# Patient Record
Sex: Male | Born: 1960 | Race: Black or African American | Hispanic: No | Marital: Married | Smoking: Former smoker
Health system: Southern US, Community
[De-identification: ages and names within clinical notes are randomized; demographics above are authoritative.]

## PROBLEM LIST (undated history)

## (undated) DIAGNOSIS — I10 Essential (primary) hypertension: Secondary | ICD-10-CM

## (undated) DIAGNOSIS — K219 Gastro-esophageal reflux disease without esophagitis: Secondary | ICD-10-CM

## (undated) DIAGNOSIS — N12 Tubulo-interstitial nephritis, not specified as acute or chronic: Secondary | ICD-10-CM

## (undated) DIAGNOSIS — D649 Anemia, unspecified: Secondary | ICD-10-CM

## (undated) HISTORY — PX: INGUINAL HERNIA REPAIR: SUR1180

## (undated) HISTORY — DX: Essential (primary) hypertension: I10

## (undated) HISTORY — DX: Gastro-esophageal reflux disease without esophagitis: K21.9

## (undated) HISTORY — DX: Anemia, unspecified: D64.9

---

## 2016-03-08 ENCOUNTER — Encounter (HOSPITAL_BASED_OUTPATIENT_CLINIC_OR_DEPARTMENT_OTHER): Payer: Self-pay | Admitting: *Deleted

## 2016-03-08 ENCOUNTER — Emergency Department (HOSPITAL_BASED_OUTPATIENT_CLINIC_OR_DEPARTMENT_OTHER)
Admission: EM | Admit: 2016-03-08 | Discharge: 2016-03-08 | Disposition: A | Discharge status: Home / Self Care | Payer: Worker's Compensation | Attending: Emergency Medicine | Admitting: Emergency Medicine

## 2016-03-08 ENCOUNTER — Emergency Department (HOSPITAL_BASED_OUTPATIENT_CLINIC_OR_DEPARTMENT_OTHER): Payer: Worker's Compensation

## 2016-03-08 DIAGNOSIS — S60122A Contusion of left index finger with damage to nail, initial encounter: Secondary | ICD-10-CM | POA: Diagnosis not present

## 2016-03-08 DIAGNOSIS — S62639A Displaced fracture of distal phalanx of unspecified finger, initial encounter for closed fracture: Secondary | ICD-10-CM

## 2016-03-08 DIAGNOSIS — Y990 Civilian activity done for income or pay: Secondary | ICD-10-CM | POA: Diagnosis not present

## 2016-03-08 DIAGNOSIS — S61311A Laceration without foreign body of left index finger with damage to nail, initial encounter: Secondary | ICD-10-CM | POA: Diagnosis not present

## 2016-03-08 DIAGNOSIS — S6992XA Unspecified injury of left wrist, hand and finger(s), initial encounter: Secondary | ICD-10-CM | POA: Diagnosis present

## 2016-03-08 DIAGNOSIS — Y9389 Activity, other specified: Secondary | ICD-10-CM | POA: Diagnosis not present

## 2016-03-08 DIAGNOSIS — W228XXA Striking against or struck by other objects, initial encounter: Secondary | ICD-10-CM | POA: Diagnosis not present

## 2016-03-08 DIAGNOSIS — S62631A Displaced fracture of distal phalanx of left index finger, initial encounter for closed fracture: Secondary | ICD-10-CM | POA: Insufficient documentation

## 2016-03-08 DIAGNOSIS — Y9289 Other specified places as the place of occurrence of the external cause: Secondary | ICD-10-CM | POA: Diagnosis not present

## 2016-03-08 DIAGNOSIS — Z23 Encounter for immunization: Secondary | ICD-10-CM | POA: Diagnosis not present

## 2016-03-08 MED ORDER — OXYCODONE-ACETAMINOPHEN 5-325 MG PO TABS
1.0000 | ORAL_TABLET | ORAL | Status: DC | PRN
  Administered 2016-03-08: 1 via ORAL
  Filled 2016-03-08: qty 1

## 2016-03-08 MED ORDER — TETANUS-DIPHTH-ACELL PERTUSSIS 5-2.5-18.5 LF-MCG/0.5 IM SUSP
0.5000 mL | Freq: Once | INTRAMUSCULAR | Status: AC
  Administered 2016-03-08: 0.5 mL via INTRAMUSCULAR
  Filled 2016-03-08: qty 0.5

## 2016-03-08 NOTE — ED Notes (Signed)
MD at bedside. 

## 2016-03-08 NOTE — ED Notes (Signed)
Swelling to tip of left index finger with laceration around cuticle, bleeding controlled

## 2016-03-08 NOTE — ED Notes (Signed)
Pt soaking finger in saline

## 2016-03-08 NOTE — Discharge Instructions (Signed)
°Cast or Splint Care  ° ° °Casts and splints support injured limbs and keep bones from moving while they heal. It is important to care for your cast or splint at home.  °HOME CARE INSTRUCTIONS  °Keep the cast or splint uncovered during the drying period. It can take 24 to 48 hours to dry if it is made of plaster. A fiberglass cast will dry in less than 1 hour.  °Do not rest the cast on anything harder than a pillow for the first 24 hours.  °Do not put weight on your injured limb or apply pressure to the cast until your health care provider gives you permission.  °Keep the cast or splint dry. Wet casts or splints can lose their shape and may not support the limb as well. A wet cast that has lost its shape can also create harmful pressure on your skin when it dries. Also, wet skin can become infected.  °Cover the cast or splint with a plastic bag when bathing or when out in the rain or snow. If the cast is on the trunk of the body, take sponge baths until the cast is removed.  °If your cast does become wet, dry it with a towel or a blow dryer on the cool setting only. °Keep your cast or splint clean. Soiled casts may be wiped with a moistened cloth.  °Do not place any hard or soft foreign objects under your cast or splint, such as cotton, toilet paper, lotion, or powder.  °Do not try to scratch the skin under the cast with any object. The object could get stuck inside the cast. Also, scratching could lead to an infection. If itching is a problem, use a blow dryer on a cool setting to relieve discomfort.  °Do not trim or cut your cast or remove padding from inside of it.  °Exercise all joints next to the injury that are not immobilized by the cast or splint. For example, if you have a long leg cast, exercise the hip joint and toes. If you have an arm cast or splint, exercise the shoulder, elbow, thumb, and fingers.  °Elevate your injured arm or leg on 1 or 2 pillows for the first 1 to 3 days to decrease swelling and  pain. It is best if you can comfortably elevate your cast so it is higher than your heart. °SEEK MEDICAL CARE IF:  °Your cast or splint cracks.  °Your cast or splint is too tight or too loose.  °You have unbearable itching inside the cast.  °Your cast becomes wet or develops a soft spot or area.  °You have a bad smell coming from inside your cast.  °You get an object stuck under your cast.  °Your skin around the cast becomes red or raw.  °You have new pain or worsening pain after the cast has been applied. °SEEK IMMEDIATE MEDICAL CARE IF:  °You have fluid leaking through the cast.  °You are unable to move your fingers or toes.  °You have discolored (blue or white), cool, painful, or very swollen fingers or toes beyond the cast.  °You have tingling or numbness around the injured area.  °You have severe pain or pressure under the cast.  °You have any difficulty with your breathing or have shortness of breath.  °You have chest pain. °This information is not intended to replace advice given to you by your health care provider. Make sure you discuss any questions you have with your health care provider.  °  Document Released: 11/10/2000 Document Revised: 09/03/2013 Document Reviewed: 05/22/2013  °Elsevier Interactive Patient Education ©2016 Elsevier [employer]  ° °

## 2016-03-08 NOTE — ED Notes (Signed)
Pt c/o injury to left index finger x 3 hrs ago

## 2016-03-08 NOTE — ED Notes (Signed)
Steri Strips applied by provider

## 2016-03-08 NOTE — ED Notes (Signed)
Pt verbalizes understanding of d/c instructions and denies any further needs at this time. 

## 2016-03-08 NOTE — ED Provider Notes (Signed)
CSN: [id number]     Arrival date & time 03/08/16  1956 History  By signing my name below, I, Dora Sims, attest that this documentation has been prepared under the direction and in the presence of physician practitioner, Deno Etienne, DO. Electronically Signed: Dora Sims, Scribe. 03/08/2016. 8:25 PM.    Chief Complaint  Patient presents with  . Finger Injury    The history is provided by the patient. No language interpreter was used.     HPI Comments: Nicholas Miller is a 55 y.o. male with no pertinent PMHx who presents to the Emergency Department complaining of left index finger laceration and pain s/p slamming his left index finger in a truck door approximately 3 hours ago. Pt was at work and crushed his left index finger in a large truck door. Pt is not UTD for tetanus. His bleeding is controlled at this time. He denies other pains or any other symptoms.  History reviewed. No pertinent past medical history. History reviewed. No pertinent past surgical history. History reviewed. No pertinent family history. Social History  Substance Use Topics  . Smoking status: Never Smoker   . Smokeless tobacco: None  . Alcohol Use: No    Review of Systems  Constitutional: Negative for fever and chills.  HENT: Negative for congestion and facial swelling.   Eyes: Negative for discharge and visual disturbance.  Respiratory: Negative for shortness of breath.   Cardiovascular: Negative for chest pain and palpitations.  Gastrointestinal: Negative for vomiting, abdominal pain and diarrhea.  Musculoskeletal: Positive for arthralgias (left index finger). Negative for myalgias.  Skin: Positive for wound (left index finger). Negative for color change and rash.  Neurological: Negative for tremors, syncope and headaches.  Psychiatric/Behavioral: Negative for confusion and dysphoric mood.    Allergies  Review of patient's allergies indicates no known allergies.  Home Medications   Prior to  Admission medications   Not on File   BP 150/85 mmHg  Pulse 93  Temp(Src) 98.3 F (36.8 C)  Resp 16  Ht 5\' 8"  (1.727 m)  Wt 239 lb (108.41 kg)  BMI 36.35 kg/m2  SpO2 98% Physical Exam  Constitutional: He is oriented to person, place, and time. He appears well-developed and well-nourished.  HENT:  Head: Normocephalic and atraumatic.  Eyes: EOM are normal. Pupils are equal, round, and reactive to light.  Neck: Normal range of motion. Neck supple. No JVD present.  Cardiovascular: Normal rate and regular rhythm.  Exam reveals no gallop and no friction rub.   No murmur heard. Pulmonary/Chest: No respiratory distress. He has no wheezes.  Abdominal: He exhibits no distension. There is no rebound and no guarding.  Musculoskeletal: Normal range of motion.  Laceration through the dermis on the ulnar side of the 2nd digit of the left hand, not extended into nailbed Very small ungal hematoma less than 50% of the nail Cap refill intact, less than 2 seconds No difficulty with ROM  Neurological: He is alert and oriented to person, place, and time.  Skin: No rash noted. No pallor.  Psychiatric: He has a normal mood and affect. His behavior is normal.  Nursing note and vitals reviewed.   ED Course  .Marland KitchenLaceration Repair Date/Time: 03/08/2016 9:08 PM Performed by: Tyrone Nine Bryona Foxworthy Authorized by: Deno Etienne Consent: Verbal consent obtained. Risks and benefits: risks, benefits and alternatives were discussed Consent given by: patient Required items: required blood products, implants, devices, and special equipment available Patient identity confirmed: verbally with patient Time out: Immediately prior to procedure a "  time out" was called to verify the correct patient, procedure, equipment, support staff and site/side marked as required. Body area: upper extremity Location details: left index finger Laceration length: 2 cm Foreign bodies: no foreign bodies Tendon involvement: none Nerve  involvement: none Vascular damage: no Preparation: Patient was prepped and draped in the usual sterile fashion. Approximation: loose Approximation difficulty: simple Dressing: 4x4 sterile gauze and splint Patient tolerance: Patient tolerated the procedure well with no immediate complications   (including critical care time)  DIAGNOSTIC STUDIES: Oxygen Saturation is 98% on RA, normal by my interpretation.    COORDINATION OF CARE: 8:39 PM Discussed treatment plan with pt at bedside and pt agreed to plan.  Labs Review Labs Reviewed - No data to display  Imaging Review Dg Finger Index Left  03/08/2016  CLINICAL DATA:  Left index finger injury today. EXAM: LEFT INDEX FINGER 2+V COMPARISON:  None. FINDINGS: Small bone fragment noted off the distal tip of the left index finger distal phalanx. No subluxation or dislocation. IMPRESSION: Small bone fragment off the tip of the distal phalanx of the left index finger. Electronically Signed   By: Rolm Baptise M.D.   On: 03/08/2016 20:43   I have personally reviewed and evaluated these images and lab results as part of my medical decision-making.   EKG Interpretation None      MDM   Final diagnoses:  Distal phalanx or phalanges, closed fracture, initial encounter    55 yo M With a chief complaint of a left pointer finger injury. Patient checked this in a car door. This happened about 3 hours ago. X-ray with a possible small fracture. We'll place in a splint. PCP follow-up. Patient had a wound without nailbed fracture. Steri-Strips   I personally performed the services described in this documentation, which was scribed in my presence. The recorded information has been reviewed and is accurate.    9:10 PM:  I have discussed the diagnosis/risks/treatment options with the patient and family and believe the pt to be eligible for discharge home to follow-up with PCP. We also discussed returning to the ED immediately if new or worsening sx  occur. We discussed the sx which are most concerning (e.g., erythema, fever,) that necessitate immediate return. Medications administered to the patient during their visit and any new prescriptions provided to the patient are listed below.  Medications given during this visit Medications  oxyCODONE-acetaminophen (PERCOCET/ROXICET) 5-325 MG per tablet 1 tablet (1 tablet Oral Given 03/08/16 2010)  Tdap (BOOSTRIX) injection 0.5 mL (0.5 mLs Intramuscular Given 03/08/16 2010)    There are no discharge medications for this patient.   The patient appears reasonably screen and/or stabilized for discharge and I doubt any other medical condition or other [facility] requiring further screening, evaluation, or treatment in the ED at this time prior to discharge.      Deno Etienne, DO 03/08/16 2111

## 2016-03-15 ENCOUNTER — Encounter (HOSPITAL_BASED_OUTPATIENT_CLINIC_OR_DEPARTMENT_OTHER): Payer: Self-pay | Admitting: *Deleted

## 2016-03-15 ENCOUNTER — Emergency Department (HOSPITAL_BASED_OUTPATIENT_CLINIC_OR_DEPARTMENT_OTHER)
Admission: EM | Admit: 2016-03-15 | Discharge: 2016-03-15 | Disposition: A | Discharge status: Home / Self Care | Payer: Worker's Compensation | Attending: Emergency Medicine | Admitting: Emergency Medicine

## 2016-03-15 DIAGNOSIS — Z8781 Personal history of (healed) traumatic fracture: Secondary | ICD-10-CM | POA: Diagnosis not present

## 2016-03-15 DIAGNOSIS — Z09 Encounter for follow-up examination after completed treatment for conditions other than malignant neoplasm: Secondary | ICD-10-CM | POA: Insufficient documentation

## 2016-03-15 DIAGNOSIS — M79645 Pain in left finger(s): Secondary | ICD-10-CM | POA: Insufficient documentation

## 2016-03-15 NOTE — ED Notes (Signed)
Pt states the metal finger splint we placed on his left pointer finger last Wednesday was cutting into his skin when he moved his hand. Pt bought another splint at [facility] that does not cut into his hand, requests md check this splint to make sure it is ok for him to use. Denies any further c/o or needs.

## 2016-03-15 NOTE — ED Provider Notes (Signed)
CSN: [id number]     Arrival date & time 03/15/16  1042 History   First MD Initiated Contact with Patient 03/15/16 1059     Chief Complaint  Patient presents with  . Follow-up     (Consider location/radiation/quality/duration/timing/severity/associated sxs/prior Treatment) HPI   Patient is a 55 year old male who presents to the ED for follow-up. Patient reports he was seen in the ED on 4/12 and was diagnosed with left index finger fracture. He states he was placed in a metal finger splint and notes that the end of splint was cutting into his skin with movement of his hand. He states he bought another splint at [facility] which does not cut into his hand and wanted to make sure that the splint was okay for him to use for his fractured finger. Patient denies any other pain or complaints at this time. He states he has been cleaning his wound daily without any, occasions. Denies fever, chills, redness, swelling, warmth, drainage, numbness, tingling, weakness.  History reviewed. No pertinent past medical history. History reviewed. No pertinent past surgical history. History reviewed. No pertinent family history. Social History  Substance Use Topics  . Smoking status: Never Smoker   . Smokeless tobacco: None  . Alcohol Use: No    Review of Systems  Constitutional: Negative for fever.  Musculoskeletal: Positive for arthralgias (left index finger). Negative for joint swelling.  Skin: Negative for color change.  Neurological: Negative for weakness and numbness.      Allergies  Review of patient's allergies indicates no known allergies.  Home Medications   Prior to Admission medications   Not on File   BP 135/87 mmHg  Pulse 78  Temp(Src) 98.8 F (37.1 C) (Oral)  Resp 18  SpO2 98% Physical Exam  Constitutional: He is oriented to person, place, and time. He appears well-developed and well-nourished. No distress.  HENT:  Head: Normocephalic and atraumatic.  Eyes: Conjunctivae and  EOM are normal. Right eye exhibits no discharge. Left eye exhibits no discharge. No scleral icterus.  Neck: Normal range of motion. Neck supple.  Cardiovascular: Normal rate.   Pulmonary/Chest: Effort normal. No respiratory distress.  Musculoskeletal: Normal range of motion. He exhibits no edema.  Healing laceration at dorsal aspect of left index finger proximal to nail bed with 2 steri strips in place. No surrounding swelling, warmth, erythema or drainage. FROM of left 2nd digit, mild TTP over left 2nd distal phalanx. 2+ radial pulse. Sensation intact. Cap refill <2. Small subungual hematoma, less than 25% of nail.  Neurological: He is alert and oriented to person, place, and time. He has normal strength. No sensory deficit.  Skin: Skin is warm and dry.  Nursing note and vitals reviewed.   ED Course  Procedures (including critical care time) Labs Review Labs Reviewed - No data to display  Imaging Review No results found. I have personally reviewed and evaluated these images and lab results as part of my medical decision-making.   EKG Interpretation None      MDM   Final diagnoses:  Follow up    Pt presents for follow-up regarding splint for his finger fracture. Chart review shows patient was seen in the ED on 4/12, diagnosed with left second distal phalanx fracture, tetanus updated in the ED, patient discharged home with finger splint and symptomatically treatment. VSS. Exam reveals healing laceration to left index finger with 2 Steri-Strips in place, no evidence of infection. Left hand neurovascularly intact. Healing left second subungual hematoma which appears to have  improved since initial evaluation. I evaluated the patient's new splint which is a U-shaped splint that extends past the tip of the distal phalanx down to the proximal phalanx. Advised patient to continue with his wound care and continue wearing his splint daily. Advised patient to follow up with his PCP in the next  1-2 weeks.    Chesley Noon [place], [place] 03/15/16 [place], MD 03/16/16 [phone]

## 2016-03-15 NOTE — Discharge Instructions (Signed)
I recommend continuing to take ibuprofen as prescribed over-the-counter as needed for pain relief. He may also continue to rest, elevate and ice your finger for 15-20 minutes 3-4 daily. Continue to keep wound clean using antibacterial soap and water and pat dry. Continue to use ear finger splint for the next week. Follow-up with your primary care provider in one week if you continue to have pain. Please return to the Emergency Department if symptoms worsen or new onset of fever, redness, swelling, warmth, drainage, numbness, tingling, weakness.

## 2016-03-22 ENCOUNTER — Emergency Department (HOSPITAL_BASED_OUTPATIENT_CLINIC_OR_DEPARTMENT_OTHER)
Admission: EM | Admit: 2016-03-22 | Discharge: 2016-03-22 | Disposition: A | Discharge status: Home / Self Care | Payer: Worker's Compensation | Attending: Emergency Medicine | Admitting: Emergency Medicine

## 2016-03-22 ENCOUNTER — Encounter (HOSPITAL_BASED_OUTPATIENT_CLINIC_OR_DEPARTMENT_OTHER): Payer: Self-pay | Admitting: Emergency Medicine

## 2016-03-22 DIAGNOSIS — Z4801 Encounter for change or removal of surgical wound dressing: Secondary | ICD-10-CM | POA: Diagnosis not present

## 2016-03-22 DIAGNOSIS — S62609D Fracture of unspecified phalanx of unspecified finger, subsequent encounter for fracture with routine healing: Secondary | ICD-10-CM

## 2016-03-22 NOTE — ED Provider Notes (Signed)
CSN: [id number]     Arrival date & time 03/22/16  0914 History   First MD Initiated Contact with Patient 03/22/16 [id number]     Chief Complaint  Patient presents with  . Wound Check     (Consider location/radiation/quality/duration/timing/severity/associated sxs/prior Treatment) HPI Comments: 55 year old male presents to the emergency department for follow-up. The patient was seen on the 12th of this month-2 weeks ago for finger injury and was noted to have a small fracture. He was placed in a splint and then returned because the splint that we had applied was hurting him and he had started using a different splint from [facility]. He returns today because his work has been keeping him on light duty and they want to know when he can go back to full duty. He reports that the finger has been healing well and is feeling better but he still has pain with heavy lifting. He has not followed up outpatient for this issue and does not have a primary care doctor.   History reviewed. No pertinent past medical history. History reviewed. No pertinent past surgical history. History reviewed. No pertinent family history. Social History  Substance Use Topics  . Smoking status: Never Smoker   . Smokeless tobacco: None  . Alcohol Use: No    Review of Systems  Constitutional: Negative for fever and chills.  Cardiovascular: Negative for chest pain.  Gastrointestinal: Negative for abdominal pain.  Genitourinary: Negative for flank pain.  Musculoskeletal: Positive for arthralgias (left index finger).  Skin: Positive for wound.  Neurological: Negative for headaches.  Hematological: Does not bruise/bleed easily.      Allergies  Review of patient's allergies indicates no known allergies.  Home Medications   Prior to Admission medications   Not on File   BP 136/93 mmHg  Pulse 84  Temp(Src) 98.7 F (37.1 C)  Resp 18  Ht 5\' 8"  (1.727 m)  Wt 240 lb (108.863 kg)  BMI 36.50 kg/m2  SpO2 98% Physical  Exam  Constitutional: He is oriented to person, place, and time. He appears well-developed and well-nourished. No distress.  HENT:  Head: Normocephalic and atraumatic.  Right Ear: External ear normal.  Left Ear: External ear normal.  Eyes: EOM are normal. Pupils are equal, round, and reactive to light.  Neck: Normal range of motion. Neck supple.  Cardiovascular: Intact distal pulses.   Pulmonary/Chest: Effort normal. No respiratory distress.  Abdominal: He exhibits no distension.  Musculoskeletal: He exhibits no edema.  Well-healed laceration the dorsum of the left index finger proximal to the nailbed. No Steri-Strips noted. Mild swelling around the nailbed. No warmth or erythema or drainage. Patient has full range of motion of the left index finger. Tenderness to palpation over the nailbed and distal phalanx. He has good capillary refill in his left index finger and normal sensation. Small subungual hematoma that is less than 10% of the nail.  Neurological: He is alert and oriented to person, place, and time.  Skin: Skin is warm and dry. No rash noted. He is not diaphoretic.  Vitals reviewed.   ED Course  Procedures (including critical care time) Labs Review Labs Reviewed - No data to display  Imaging Review No results found. I have personally reviewed and evaluated these images and lab results as part of my medical decision-making.   EKG Interpretation None      MDM  Patient was seen and evaluated in stable condition. Appears to have a well-healing finger laceration and fracture. Patient was educated on need  to find a provider who can perform routine follow-up examinations and determine when he can return to normal duty at work. At this time will provide note for continued light duty. Instructed the patient to follow up outpatient which he expressed understanding of.  I told him it was in his best interest to have the same provider evaluating it each time. Patient was discharged in  stable condition with instruction to continue to use the splint. Final diagnoses:  None    1. Recheck, left index finger last laceration and fracture    Harvel Quale, MD 03/22/16 1005

## 2016-03-22 NOTE — Discharge Instructions (Signed)
You were seen and evaluated again today for your finger fracture. This appears to be healing well. Continued to stay on light duty at this time at your place of work. He need to follow up outpatient with the primary care doctor who can continue to recheck this and he'll will be able to compare how the finger is healing each visit in order to instruct you better on when to return to full work. Continue to ice and elevate the finger. Continue to keep it in the finger splint. It appears to be healing well but will take time.  Finger Fracture Fractures of fingers are breaks in the bones of the fingers. There are many types of fractures. There are different ways of treating these fractures. Your health care provider will discuss the best way to treat your fracture. CAUSES Traumatic injury is the main cause of broken fingers. These include:  Injuries while playing sports.  Workplace injuries.  Falls. RISK FACTORS Activities that can increase your risk of finger fractures include:  Sports.  Workplace activities that involve machinery.  A condition called osteoporosis, which can make your bones less dense and cause them to fracture more easily. SIGNS AND SYMPTOMS The main symptoms of a broken finger are pain and swelling within 15 minutes after the injury. Other symptoms include:  Bruising of your finger.  Stiffness of your finger.  Numbness of your finger.  Exposed bones (compound fracture) if the fracture is severe. DIAGNOSIS  The best way to diagnose a broken bone is with X-ray imaging. Additionally, your health care provider will use this X-ray image to evaluate the position of the broken finger bones.  TREATMENT  Finger fractures can be treated with:   Nonreduction--This means the bones are in place. The finger is splinted without changing the positions of the bone pieces. The splint is usually left on for about a week to 10 days. This will depend on your fracture and what your health  care provider thinks.  Closed reduction--The bones are put back into position without using surgery. The finger is then splinted.  Open reduction and internal fixation--The fracture site is opened. Then the bone pieces are fixed into place with pins or some type of hardware. This is seldom required. It depends on the severity of the fracture. HOME CARE INSTRUCTIONS   Follow your health care provider's instructions regarding activities, exercises, and physical therapy.  Only take over-the-counter or prescription medicines for pain, discomfort, or fever as directed by your health care provider. SEEK MEDICAL CARE IF: You have pain or swelling that limits the motion or use of your fingers. SEEK IMMEDIATE MEDICAL CARE IF:  Your finger becomes numb. MAKE SURE YOU:   Understand these instructions.  Will watch your condition.  Will get help right away if you are not doing well or get worse.   This information is not intended to replace advice given to you by your health care provider. Make sure you discuss any questions you have with your health care provider.   Document Released: 02/25/2001 Document Revised: 09/03/2013 Document Reviewed: 06/25/2013 Elsevier Interactive Patient Education [employer].

## 2017-01-18 ENCOUNTER — Encounter (HOSPITAL_BASED_OUTPATIENT_CLINIC_OR_DEPARTMENT_OTHER): Payer: Self-pay | Admitting: Emergency Medicine

## 2017-01-18 ENCOUNTER — Emergency Department (HOSPITAL_BASED_OUTPATIENT_CLINIC_OR_DEPARTMENT_OTHER): Payer: Self-pay

## 2017-01-18 ENCOUNTER — Emergency Department (HOSPITAL_BASED_OUTPATIENT_CLINIC_OR_DEPARTMENT_OTHER)
Admission: EM | Admit: 2017-01-18 | Discharge: 2017-01-18 | Disposition: A | Discharge status: Home / Self Care | Payer: Self-pay | Attending: Emergency Medicine | Admitting: Emergency Medicine

## 2017-01-18 DIAGNOSIS — J069 Acute upper respiratory infection, unspecified: Secondary | ICD-10-CM | POA: Insufficient documentation

## 2017-01-18 MED ORDER — FLUTICASONE PROPIONATE 50 MCG/ACT NA SUSP: 2.0000 | Freq: Every day | NASAL | 0 refills | Status: DC

## 2017-01-18 MED ORDER — BENZONATATE 100 MG PO CAPS: 100.0000 mg | ORAL_CAPSULE | Freq: Three times a day (TID) | ORAL | 0 refills | Status: DC

## 2017-01-18 MED ORDER — DM-GUAIFENESIN ER 30-600 MG PO TB12: 1.0000 | ORAL_TABLET | Freq: Two times a day (BID) | ORAL | 0 refills | Status: DC | PRN

## 2017-01-18 MED FILL — MUCINEX DM ER 600-30 MG TAB: 30-600 | 10 days supply | Qty: 20 | Fill #0

## 2017-01-18 MED FILL — FLUTICASONE PROP 50 MCG SPR: 50 | 30 days supply | Qty: 16 | Fill #0

## 2017-01-18 MED FILL — BENZONATATE 100 MG CAP: 100 | 7 days supply | Qty: 21 | Fill #0

## 2017-01-18 NOTE — ED Notes (Signed)
Cough, yellow mucus with fever and aching since yesterday.

## 2017-01-18 NOTE — Discharge Instructions (Signed)
1. Medications: flonase, mucinex, tessalon, usual home medications 2. Treatment: rest, drink plenty of fluids, take tylenol or ibuprofen for fever control 3. Follow Up: Please followup with your primary doctor in 3 days for discussion of your diagnoses and further evaluation after today's visit; if you do not have a primary care doctor use the resource guide provided to find one; Return to the ER for high fevers, difficulty breathing or other concerning symptoms   Get help right away if: You have severe or persistent: Headache. Ear pain. Sinus pain. Chest pain. You have chronic lung disease and any of the following: Wheezing. Prolonged cough. Coughing up blood. A change in your usual mucus. You have a stiff neck. You have changes in your: Vision. Hearing. Thinking. Mood.

## 2017-01-18 NOTE — ED Triage Notes (Signed)
Patient states that he has had a fever since last night  - the patient reports that he also has a cough

## 2017-01-18 NOTE — ED Provider Notes (Signed)
[facility] DEPT [facility] Provider Note   CSN: [id number] Arrival date & time: 01/18/17  1515  By signing my name below, I, Dora Sims, attest that this documentation has been prepared under the direction and in the presence of Fabianna Keats, [place]. Electronically Signed: Dora Sims, Scribe. 01/18/2017. 4:10 PM.  History   Chief Complaint Chief Complaint  Patient presents with  . Fever    The history is provided by the patient. No language interpreter was used.     HPI Comments: Nicholas Miller is a 56 y.o. male who presents to the Emergency Department complaining of persistent subjective fever and chills beginning last night. He reports associated nasal congestion, occasional episodes of diaphoresis, and a productive cough with yellow/green sputum. Patient notes his symptoms have improved somewhat since last night. He has tried Nyquil and Tylenol with mild improvement of his symptoms. No h/o COPD or asthma. He is a non-smoker. Pt denies nausea, vomiting, diarrhea, abdominal pain, body aches, or any other associated symptoms.  History reviewed. No pertinent past medical history.  There are no active problems to display for this patient.   History reviewed. No pertinent surgical history.     Home Medications    Prior to Admission medications   Medication Sig Start Date End Date Taking? Authorizing Provider  benzonatate (TESSALON) 100 MG capsule Take 1 capsule (100 mg total) by mouth every 8 (eight) hours. 01/18/17   Ciarra Braddy Manuel [place], [place]  dextromethorphan-guaiFENesin (MUCINEX DM) 30-600 MG 12hr tablet Take 1 tablet by mouth 2 (two) times daily as needed for cough. 01/18/17   Galdino Hinchman Manuel [place], [place]  fluticasone (FLONASE) 50 MCG/ACT nasal spray Place 2 sprays into both nostrils daily. 01/18/17   [place], [place]    Family History History reviewed. No pertinent family history.  Social History Social History  Substance Use Topics  . Smoking status:  Never Smoker  . Smokeless tobacco: Never Used  . Alcohol use No     Allergies   Patient has no known allergies.   Review of Systems Review of Systems  Constitutional: Positive for chills, diaphoresis and fever.  HENT: Positive for congestion.   Respiratory: Positive for cough.   Gastrointestinal: Negative for abdominal pain, diarrhea, nausea and vomiting.  Musculoskeletal: Negative for myalgias.     Physical Exam Updated Vital Signs BP 133/86 (BP Location: Right Arm)   Pulse 105   Temp 100 F (37.8 C) (Oral)   Resp 18   Ht 5\' 8"  (1.727 m)   Wt 106.1 kg   SpO2 100%   BMI 35.58 kg/m   Physical Exam  Constitutional: He is oriented to person, place, and time. He appears well-developed and well-nourished.  Well appearing  HENT:  Head: Normocephalic and atraumatic.  Right Ear: External ear normal.  Left Ear: External ear normal.  Nose: Nose normal.  Mouth/Throat: Oropharynx is clear and moist. No oropharyngeal exudate.  Oropharynx without evidence of redness or exudates. Tonsils without evidence of redness, swelling, or exudates. TM's appear normal with no evidence of bulging. EAC appear non erythematous and not swollen  Eyes: EOM are normal. Pupils are equal, round, and reactive to light.  Neck: Normal range of motion.  Normal ROM. No nuchal rigidity.   Cardiovascular: Normal rate and normal heart sounds.   Pulmonary/Chest: Effort normal and breath sounds normal. No respiratory distress. He has no wheezes. He has no rales.  Lungs CTA. No wheezing. No rales. No stridor. Normal work of breathing  Abdominal: Soft. There is no  tenderness. There is no rebound and no guarding.  Soft and nontender. No rebound. No guarding. Negative murphy's sign. No focal tenderness at McBurney's point. No CVA tenderness. No evidence of hernia  Neurological: He is alert and oriented to person, place, and time.  Skin: Skin is warm.  Psychiatric: He has a normal mood and affect. His behavior is  normal.  Nursing note and vitals reviewed.    ED Treatments / Results  Labs (all labs ordered are listed, but only abnormal results are displayed) Labs Reviewed - No data to display  EKG  EKG Interpretation None       Radiology Dg Chest 2 View  Result Date: 01/18/2017 CLINICAL DATA:  Cough, fever, body aches for 2 day EXAM: CHEST  2 VIEW COMPARISON:  None. FINDINGS: Normal heart size. Lungs clear. No pneumothorax. No pleural effusion. IMPRESSION: No active cardiopulmonary disease. Electronically Signed   By: Marybelle Killings M.D.   On: 01/18/2017 16:04    Procedures Procedures (including critical care time)  DIAGNOSTIC STUDIES: Oxygen Saturation is 100% on RA, normal by my interpretation.    COORDINATION OF CARE: 4:17 PM Discussed treatment plan with pt at bedside and pt agreed to plan.  Medications Ordered in ED Medications - No data to display   Initial Impression / Assessment and Plan / ED Course  I have reviewed the triage vital signs and the nursing notes.  Pertinent labs & imaging results that were available during my care of the patient were reviewed by me and considered in my medical decision making (see chart for details).    Patients symptoms are consistent with URI, likely viral etiology. On exam, pt in NAD. No hypoxia. Low grade fever. Lungs clear, Heart sounds clear. Normal work of breathing. TMs clear. Throat benign. Abdomen nontender/soft. Pt CXR negative for acute infiltrate. Discussed that antibiotics are not indicated for viral infections. Pt will be discharged with symptomatic treatment.  Verbalizes understanding and is agreeable with plan. Pt is hemodynamically stable & in NAD prior to dc. Patient given instructions to follow up with primary care provider in 3-5 days during today's visit. Resistance to immediately return to the emergency department discussed.   Final Clinical Impressions(s) / ED Diagnoses   Final diagnoses:  Upper respiratory tract  infection, unspecified type    New Prescriptions New Prescriptions   BENZONATATE (TESSALON) 100 MG CAPSULE    Take 1 capsule (100 mg total) by mouth every 8 (eight) hours.   DEXTROMETHORPHAN-GUAIFENESIN (MUCINEX DM) 30-600 MG 12HR TABLET    Take 1 tablet by mouth 2 (two) times daily as needed for cough.   FLUTICASONE (FLONASE) 50 MCG/ACT NASAL SPRAY    Place 2 sprays into both nostrils daily.   I personally performed the services described in this documentation, which was scribed in my presence. The recorded information has been reviewed and is accurate.   [place], [place] 01/18/17 1633    Davonna Belling, MD 01/19/17 0000

## 2018-07-25 ENCOUNTER — Emergency Department (HOSPITAL_BASED_OUTPATIENT_CLINIC_OR_DEPARTMENT_OTHER): Payer: BLUE CROSS/BLUE SHIELD

## 2018-07-25 ENCOUNTER — Other Ambulatory Visit: Payer: Self-pay

## 2018-07-25 ENCOUNTER — Observation Stay (HOSPITAL_BASED_OUTPATIENT_CLINIC_OR_DEPARTMENT_OTHER)
Admission: EM | Admit: 2018-07-25 | Discharge: 2018-07-28 | Disposition: A | Discharge status: Home / Self Care | Payer: BLUE CROSS/BLUE SHIELD | Attending: Internal Medicine | Admitting: Internal Medicine

## 2018-07-25 ENCOUNTER — Encounter (HOSPITAL_BASED_OUTPATIENT_CLINIC_OR_DEPARTMENT_OTHER): Payer: Self-pay

## 2018-07-25 DIAGNOSIS — R1011 Right upper quadrant pain: Secondary | ICD-10-CM | POA: Diagnosis present

## 2018-07-25 DIAGNOSIS — D500 Iron deficiency anemia secondary to blood loss (chronic): Principal | ICD-10-CM | POA: Insufficient documentation

## 2018-07-25 DIAGNOSIS — R197 Diarrhea, unspecified: Secondary | ICD-10-CM | POA: Diagnosis not present

## 2018-07-25 DIAGNOSIS — D122 Benign neoplasm of ascending colon: Secondary | ICD-10-CM | POA: Diagnosis not present

## 2018-07-25 DIAGNOSIS — B9681 Helicobacter pylori [H. pylori] as the cause of diseases classified elsewhere: Secondary | ICD-10-CM | POA: Insufficient documentation

## 2018-07-25 DIAGNOSIS — D124 Benign neoplasm of descending colon: Secondary | ICD-10-CM | POA: Diagnosis not present

## 2018-07-25 DIAGNOSIS — D649 Anemia, unspecified: Secondary | ICD-10-CM | POA: Diagnosis present

## 2018-07-25 DIAGNOSIS — F329 Major depressive disorder, single episode, unspecified: Secondary | ICD-10-CM | POA: Diagnosis not present

## 2018-07-25 DIAGNOSIS — R195 Other fecal abnormalities: Secondary | ICD-10-CM | POA: Diagnosis not present

## 2018-07-25 DIAGNOSIS — N12 Tubulo-interstitial nephritis, not specified as acute or chronic: Secondary | ICD-10-CM

## 2018-07-25 DIAGNOSIS — Z87891 Personal history of nicotine dependence: Secondary | ICD-10-CM | POA: Insufficient documentation

## 2018-07-25 DIAGNOSIS — K3189 Other diseases of stomach and duodenum: Secondary | ICD-10-CM | POA: Diagnosis not present

## 2018-07-25 DIAGNOSIS — K219 Gastro-esophageal reflux disease without esophagitis: Secondary | ICD-10-CM | POA: Insufficient documentation

## 2018-07-25 DIAGNOSIS — K760 Fatty (change of) liver, not elsewhere classified: Secondary | ICD-10-CM | POA: Diagnosis not present

## 2018-07-25 DIAGNOSIS — R6510 Systemic inflammatory response syndrome (SIRS) of non-infectious origin without acute organ dysfunction: Secondary | ICD-10-CM | POA: Diagnosis present

## 2018-07-25 DIAGNOSIS — E871 Hypo-osmolality and hyponatremia: Secondary | ICD-10-CM | POA: Diagnosis not present

## 2018-07-25 DIAGNOSIS — E860 Dehydration: Secondary | ICD-10-CM | POA: Diagnosis not present

## 2018-07-25 DIAGNOSIS — K2950 Unspecified chronic gastritis without bleeding: Secondary | ICD-10-CM | POA: Insufficient documentation

## 2018-07-25 DIAGNOSIS — Z79899 Other long term (current) drug therapy: Secondary | ICD-10-CM | POA: Insufficient documentation

## 2018-07-25 DIAGNOSIS — N289 Disorder of kidney and ureter, unspecified: Secondary | ICD-10-CM

## 2018-07-25 HISTORY — DX: Tubulo-interstitial nephritis, not specified as acute or chronic: N12

## 2018-07-25 LAB — COMPREHENSIVE METABOLIC PANEL
ALBUMIN: 3.1 g/dL — AB (ref 3.5–5.0)
ALK PHOS: 59 U/L (ref 38–126)
ALT: 54 U/L — AB (ref 0–44)
AST: 60 U/L — ABNORMAL HIGH (ref 15–41)
Anion gap: 14 (ref 5–15)
BUN: 19 mg/dL (ref 6–20)
CALCIUM: 7.8 mg/dL — AB (ref 8.9–10.3)
CO2: 20 mmol/L — AB (ref 22–32)
CREATININE: 1.93 mg/dL — AB (ref 0.61–1.24)
Chloride: 96 mmol/L — ABNORMAL LOW (ref 98–111)
GFR calc Af Amer: 43 mL/min — ABNORMAL LOW (ref >60)
GFR calc non Af Amer: 37 mL/min — ABNORMAL LOW (ref >60)
Glucose, Bld: 107 mg/dL — ABNORMAL HIGH (ref 70–99)
Potassium: 3.5 mmol/L (ref 3.5–5.1)
SODIUM: 130 mmol/L — AB (ref 135–145)
Total Bilirubin: 2.5 mg/dL — ABNORMAL HIGH (ref 0.3–1.2)
Total Protein: 8 g/dL (ref 6.5–8.1)

## 2018-07-25 LAB — CBC WITH DIFFERENTIAL/PLATELET
Basophils Absolute: 0 10*3/uL (ref 0.0–0.1)
Basophils Relative: 0 %
EOS ABS: 0 10*3/uL (ref 0.0–0.7)
EOS PCT: 0 %
HCT: 29.6 % — ABNORMAL LOW (ref 39.0–52.0)
HEMOGLOBIN: 9.7 g/dL — AB (ref 13.0–17.0)
LYMPHS ABS: 1.1 10*3/uL (ref 0.7–4.0)
LYMPHS PCT: 13 %
MCH: 29.8 pg (ref 26.0–34.0)
MCHC: 32.8 g/dL (ref 30.0–36.0)
MCV: 90.8 fL (ref 78.0–100.0)
Monocytes Absolute: 1 10*3/uL (ref 0.1–1.0)
Monocytes Relative: 12 %
NEUTROS PCT: 75 %
Neutro Abs: 6.5 10*3/uL (ref 1.7–7.7)
Platelets: 286 10*3/uL (ref 150–400)
RBC: 3.26 MIL/uL — AB (ref 4.22–5.81)
RDW: 11.6 % (ref 11.5–15.5)
WBC: 8.6 10*3/uL (ref 4.0–10.5)

## 2018-07-25 LAB — URINALYSIS, ROUTINE W REFLEX MICROSCOPIC
Glucose, UA: NEGATIVE mg/dL
Ketones, ur: 40 mg/dL — AB
Leukocytes, UA: NEGATIVE
Nitrite: NEGATIVE
Protein, ur: 100 mg/dL — AB
Specific Gravity, Urine: 1.02 (ref 1.005–1.030)
pH: 6 (ref 5.0–8.0)

## 2018-07-25 LAB — URINALYSIS, MICROSCOPIC (REFLEX)

## 2018-07-25 LAB — I-STAT CG4 LACTIC ACID, ED: Lactic Acid, Venous: 1.43 mmol/L (ref 0.5–1.9)

## 2018-07-25 LAB — TROPONIN I

## 2018-07-25 LAB — OCCULT BLOOD X 1 CARD TO LAB, STOOL: Fecal Occult Bld: NEGATIVE

## 2018-07-25 LAB — BILIRUBIN, DIRECT: Bilirubin, Direct: 1 mg/dL — ABNORMAL HIGH (ref 0.0–0.2)

## 2018-07-25 LAB — LIPASE, BLOOD: Lipase: 68 U/L — ABNORMAL HIGH (ref 11–51)

## 2018-07-25 MED ORDER — SODIUM CHLORIDE 0.9 % IV BOLUS
1000.0000 mL | Freq: Once | INTRAVENOUS | Status: AC
  Administered 2018-07-25: 1000 mL via INTRAVENOUS

## 2018-07-25 MED ORDER — SODIUM CHLORIDE 0.9 % IV SOLN
1.0000 g | INTRAVENOUS | Status: DC
  Administered 2018-07-26: 1 g via INTRAVENOUS
  Filled 2018-07-25 (×2): qty 10

## 2018-07-25 MED ORDER — ONDANSETRON HCL 4 MG/2ML IJ SOLN: 4.0000 mg | Freq: Four times a day (QID) | INTRAMUSCULAR | Status: DC | PRN

## 2018-07-25 MED ORDER — ACETAMINOPHEN 650 MG RE SUPP: 650.0000 mg | Freq: Four times a day (QID) | RECTAL | Status: DC | PRN

## 2018-07-25 MED ORDER — SODIUM CHLORIDE 0.9 % IV SOLN
INTRAVENOUS | Status: AC
  Administered 2018-07-25: 23:00:00 via INTRAVENOUS

## 2018-07-25 MED ORDER — ONDANSETRON HCL 4 MG PO TABS: 4.0000 mg | ORAL_TABLET | Freq: Four times a day (QID) | ORAL | Status: DC | PRN

## 2018-07-25 MED ORDER — SODIUM CHLORIDE 0.9 % IV SOLN
1.0000 g | Freq: Once | INTRAVENOUS | Status: AC
  Administered 2018-07-25: 1 g via INTRAVENOUS
  Filled 2018-07-25: qty 10

## 2018-07-25 MED ORDER — IOPAMIDOL (ISOVUE-300) INJECTION 61%
100.0000 mL | Freq: Once | INTRAVENOUS | Status: AC | PRN
  Administered 2018-07-25: 100 mL via INTRAVENOUS

## 2018-07-25 MED ORDER — ACETAMINOPHEN 325 MG PO TABS
ORAL_TABLET | ORAL | Status: AC
  Administered 2018-07-25: 650 mg
  Filled 2018-07-25: qty 2

## 2018-07-25 MED ORDER — MORPHINE SULFATE (PF) 4 MG/ML IV SOLN: 4.0000 mg | INTRAVENOUS | Status: DC | PRN

## 2018-07-25 MED ORDER — ACETAMINOPHEN 325 MG PO TABS: 650.0000 mg | ORAL_TABLET | Freq: Four times a day (QID) | ORAL | Status: DC | PRN

## 2018-07-25 MED ORDER — HYDROCODONE-ACETAMINOPHEN 5-325 MG PO TABS
1.0000 | ORAL_TABLET | ORAL | Status: DC | PRN
  Administered 2018-07-25: 2 via ORAL
  Filled 2018-07-25: qty 2

## 2018-07-25 NOTE — ED Triage Notes (Signed)
Pt with n/v/d, fever x 3 weeks-also c/o CP and right arm pain x 1 week

## 2018-07-25 NOTE — Progress Notes (Addendum)
On call transfer acceptance note: 57 y/o male with no significant PMH, presented to med center [facility] with abdominal pain, fever, chills. W/U revealed right sided pyelonephritis /ureteritis and AKI. ?passed a stone. Non specific bili elevation but nl GB on USG/CT. Hemodynamically stable per ED APP with nl lactate/white count. BP 11/56, HR 87, temp 99.3, o2 sat 97% on RA.  Accepted to [facility] hospital for Med/surg bed / IV abx . Transfer center and overnight team aware.

## 2018-07-25 NOTE — ED Notes (Signed)
Patient transported to CT 

## 2018-07-25 NOTE — ED Notes (Signed)
Transported to [place].

## 2018-07-25 NOTE — ED Provider Notes (Addendum)
[facility] EMERGENCY DEPARTMENT Provider Note   CSN: [id number] Arrival date & time: 07/25/18  1349  History   Chief Complaint Chief Complaint  Patient presents with  . Emesis    HPI Nicholas Miller is a 57 y.o. male with h/o kidney stones, lack of routine medical care, is here for evaluation of fevers and chills onset today.  Patient reports multiple associated symptoms that he has had for the last 2 to 3 weeks including decreased appetite, generalized weakness, nbnb emesis, diarrhea, right sided chest/armpit pain, dizziness and early fatigue with exeriton.  States he is having at least one episode of non-bloody non-bilious emesis daily and looser, yellow stools x5 nightly for the last 2 weeks.  He describes a right-sided chest/armpit pain as a dull ache, constant for the last 2 weeks, worse with movement.  He has been to urgent care twice and was told that he had a virus.  No associated headaches, sore throat, cough, exertional chest pain, abdominal pain, dysuria, hematuria, melena or hematochezia.  Interventions include drinking Pedialyte and Powerade.  HPI  History reviewed. No pertinent past medical history.  There are no active problems to display for this patient.   History reviewed. No pertinent surgical history.      Home Medications    Prior to Admission medications   Medication Sig Start Date End Date Taking? Authorizing Provider  benzonatate (TESSALON) 100 MG capsule Take 1 capsule (100 mg total) by mouth every 8 (eight) hours. 01/18/17   Bettey Costa, PA  dextromethorphan-guaiFENesin (MUCINEX DM) 30-600 MG 12hr tablet Take 1 tablet by mouth 2 (two) times daily as needed for cough. 01/18/17   Bettey Costa, PA  fluticasone (FLONASE) 50 MCG/ACT nasal spray Place 2 sprays into both nostrils daily. 01/18/17   Bettey Costa, PA    Family History No family history on file.  Social History Social History   Tobacco Use  . Smoking  status: Former Research scientist (life sciences)  . Smokeless tobacco: Never Used  Substance Use Topics  . Alcohol use: No  . Drug use: No     Allergies   Patient has no known allergies.   Review of Systems Review of Systems  Constitutional: Positive for appetite change, chills and fever.  Gastrointestinal: Positive for diarrhea, nausea and vomiting.  Musculoskeletal: Positive for arthralgias (right armpit/upper chest pain).  Neurological: Positive for dizziness and weakness.  All other systems reviewed and are negative.    Physical Exam Updated Vital Signs BP (!) 111/56   Pulse 87   Temp 99.3 F (37.4 C) (Oral)   Resp (!) 23   Ht 5\' 8"  (1.727 m)   Wt 111.1 kg   SpO2 97%   BMI 37.25 kg/m   Physical Exam  Constitutional: He is oriented to person, place, and time. He appears well-developed and well-nourished.  Nontoxic.  In no distress.  HENT:  Head: Normocephalic and atraumatic.  Nose: Nose normal.  Dry lips but MMM.  Oropharynx and tonsils normal.  Eyes: Pupils are equal, round, and reactive to light. Conjunctivae and EOM are normal.  Neck: Normal range of motion.  Cardiovascular: Normal rate, regular rhythm and normal heart sounds.  2+ DP and radial pulses bilaterally. No LE edema.   Pulmonary/Chest: Effort normal and breath sounds normal. He exhibits tenderness.  Reproducible left lateral mid axillary and anterior right chest wall tenderness. No pain with AROM of right arm.   Abdominal: Soft. Bowel sounds are normal. There is tenderness.  Mild RUQ tenderness  with deep palpation, negative Murphy's. Mild diffuse lower abd tenderness. No G/R/R. No suprapubic or CVA tenderness. Negative McBurney's.  Active bowel sounds to lower quadrants.  Musculoskeletal: Normal range of motion.  Neurological: He is alert and oriented to person, place, and time.  Skin: Skin is warm and dry. Capillary refill takes less than 2 seconds.  Psychiatric: He has a normal mood and affect. His behavior is normal.  Judgment and thought content normal.  Nursing note and vitals reviewed.    ED Treatments / Results  Labs (all labs ordered are listed, but only abnormal results are displayed) Labs Reviewed  COMPREHENSIVE METABOLIC PANEL - Abnormal; Notable for the following components:      Result Value   Sodium 130 (*)    Chloride 96 (*)    CO2 20 (*)    Glucose, Bld 107 (*)    Creatinine, Ser 1.93 (*)    Calcium 7.8 (*)    Albumin 3.1 (*)    AST 60 (*)    ALT 54 (*)    Total Bilirubin 2.5 (*)    GFR calc non Af Amer 37 (*)    GFR calc Af Amer 43 (*)    All other components within normal limits  LIPASE, BLOOD - Abnormal; Notable for the following components:   Lipase 68 (*)    All other components within normal limits  CBC WITH DIFFERENTIAL/PLATELET - Abnormal; Notable for the following components:   RBC 3.26 (*)    Hemoglobin 9.7 (*)    HCT 29.6 (*)    All other components within normal limits  URINALYSIS, ROUTINE W REFLEX MICROSCOPIC - Abnormal; Notable for the following components:   Color, Urine ORANGE (*)    Hgb urine dipstick MODERATE (*)    Bilirubin Urine MODERATE (*)    Ketones, ur 40 (*)    Protein, ur 100 (*)    All other components within normal limits  URINALYSIS, MICROSCOPIC (REFLEX) - Abnormal; Notable for the following components:   Bacteria, UA FEW (*)    All other components within normal limits  BILIRUBIN, DIRECT - Abnormal; Notable for the following components:   Bilirubin, Direct 1.0 (*)    All other components within normal limits  CULTURE, BLOOD (ROUTINE X 2)  CULTURE, BLOOD (ROUTINE X 2)  URINE CULTURE  GASTROINTESTINAL PANEL BY PCR, STOOL (REPLACES STOOL CULTURE)  TROPONIN I  OCCULT BLOOD X 1 CARD TO LAB, STOOL  HEPATITIS PANEL, ACUTE  I-STAT CG4 LACTIC ACID, ED    EKG EKG Interpretation  Date/Time:  Thursday July 25 2018 14:15:12 EDT Ventricular Rate:  109 PR Interval:    QRS Duration: 82 QT Interval:  337 QTC Calculation: 454 R  Axis:   43 Text Interpretation:  Sinus tachycardia Ventricular premature complex Low voltage, precordial leads Confirmed by Quintella Reichert [phone]) on 07/25/2018 6:49:11 PM   Radiology Dg Chest 2 View  Result Date: 07/25/2018 CLINICAL DATA:  Fever x3 weeks. Former smoker with right upper chest and right arm pain. EXAM: CHEST - 2 VIEW COMPARISON:  01/18/2017 FINDINGS: Top-normal size heart. Slight uncoiling of the nonaneurysmal thoracic aorta. No acute pulmonary consolidation or CHF. No effusion or pneumothorax. No acute osseous abnormality. Degenerative changes are present the dorsal spine with slight depression of the T10 superior endplate since prior. IMPRESSION: No active cardiopulmonary disease. Slightly depressed appearance of the T10 superior endplate since previous exam may reflect a remote mild superior endplate compression fracture. Electronically Signed   By: Meredith Leeds.D.  On: 07/25/2018 14:58   Dg Shoulder Right  Result Date: 07/25/2018 CLINICAL DATA:  RIGHT shoulder pain for 1 week. EXAM: RIGHT SHOULDER - 2+ VIEW COMPARISON:  None. FINDINGS: The humeral head is well-formed and located. The subacromial, glenohumeral and acromioclavicular joint spaces are intact. No destructive bony lesions. Soft tissue planes are non-suspicious. IMPRESSION: Negative. Electronically Signed   By: Elon Alas M.D.   On: 07/25/2018 17:19   Ct Abdomen Pelvis W Contrast  Result Date: 07/25/2018 CLINICAL DATA:  RIGHT upper quadrant abdominal pain and fever for 3 weeks. EXAM: CT ABDOMEN AND PELVIS WITH CONTRAST TECHNIQUE: Multidetector CT imaging of the abdomen and pelvis was performed using the standard protocol following bolus administration of intravenous contrast. CONTRAST:  171mL ISOVUE-300 IOPAMIDOL (ISOVUE-300) INJECTION 61% COMPARISON:  None. FINDINGS: Lower chest: No acute abnormality. Hepatobiliary: No focal liver abnormality is seen. No gallstones, gallbladder wall thickening, or biliary  dilatation. Pancreas: Unremarkable. No pancreatic ductal dilatation or surrounding inflammatory changes. Spleen: Normal in size without focal abnormality. Adrenals/Urinary Tract: Ill-defined edema/inflammation within the RIGHT perinephric space. Subtle heterogeneity of the RIGHT renal cortex suggests associated cortical edema. Additional mild inflammation/fluid stranding about the RIGHT ureter, suggesting ascending infection. Bladder is unremarkable, partially decompressed. LEFT kidney appears normal. No LEFT-sided perinephric edema. LEFT ureter appears normal. No ureteral or bladder calculi identified. Stomach/Bowel: No dilated large or small bowel loops. No bowel wall thickening or evidence of bowel wall inflammation seen. Appendix is normal. Stomach appears normal, partially decompressed. Vascular/Lymphatic: No significant vascular findings are present. No enlarged abdominal or pelvic lymph nodes. Reproductive: Prostate is unremarkable. Other: No abscess collection seen.  No free intraperitoneal air. Musculoskeletal: No acute or suspicious osseous finding. IMPRESSION: 1. Findings are highly suggestive of ascending infection of the RIGHT ureter and associated RIGHT-sided pyelonephritis. Findings could alternatively represent recently passed stone, considered less likely given patient's symptoms. Recommend correlation with urinalysis. 2. Remainder of the abdomen and pelvis CT is unremarkable, as detailed above. Electronically Signed   By: Franki Cabot M.D.   On: 07/25/2018 18:34   US Abdomen Limited Ruq  Result Date: 07/25/2018 CLINICAL DATA:  57 year old male with acute RIGHT UPPER quadrant abdominal pain and fever for 3 weeks. EXAM: ULTRASOUND ABDOMEN LIMITED RIGHT UPPER QUADRANT COMPARISON:  None. FINDINGS: Gallbladder: The gallbladder is unremarkable. There is no evidence of cholelithiasis or acute cholecystitis. Common bile duct: Diameter: 2.7 mm. No evidence of intrahepatic or extrahepatic biliary  dilatation. Liver: Diffusely increased echogenicity noted. No definite focal hepatic abnormality. Portal vein is patent on color Doppler imaging with normal direction of blood flow towards the liver. IMPRESSION: 1. Increased hepatic echogenicity compatible with hepatic steatosis. No other hepatic abnormalities. 2. No evidence of cholelithiasis, acute cholecystitis or biliary dilatation. Electronically Signed   By: Margarette Canada M.D.   On: 07/25/2018 16:59    Procedures .Critical Care Performed by: Kinnie Feil, PA-C Authorized by: Kinnie Feil, PA-C   Critical care provider statement:    Critical care time (minutes):  45   Critical care was necessary to treat or prevent imminent or life-threatening deterioration of the following conditions:  Shock and renal failure   Critical care was time spent personally by me on the following activities:  Evaluation of patient's response to treatment, examination of patient, ordering and performing treatments and interventions, ordering and review of laboratory studies, ordering and review of radiographic studies, pulse oximetry, re-evaluation of patient's condition, obtaining history from patient or surrogate and discussions with consultants   I assumed  direction of critical care for this patient from another provider in my specialty: no     (including critical care time)  Medications Ordered in ED Medications  cefTRIAXone (ROCEPHIN) 1 g in sodium chloride 0.9 % 100 mL IVPB (1 g Intravenous New Bag/Given 07/25/18 1855)  sodium chloride 0.9 % bolus 1,000 mL (0 mLs Intravenous Stopped 07/25/18 1619)  acetaminophen (TYLENOL) 325 MG tablet (650 mg  Given 07/25/18 1438)  sodium chloride 0.9 % bolus 1,000 mL (0 mLs Intravenous Stopped 07/25/18 1725)  iopamidol (ISOVUE-300) 61 % injection 100 mL (100 mLs Intravenous Contrast Given 07/25/18 1734)     Initial Impression / Assessment and Plan / ED Course  I have reviewed the triage vital signs and the  nursing notes.  Pertinent labs & imaging results that were available during my care of the patient were reviewed by me and considered in my medical decision making (see chart for details).  Clinical Course as of Jul 26 1923  Thu Jul 25, 2018  1405 Temp(!): 101.2 F (38.4 C) [CG]  1405 Pulse Rate(!): 115 [CG]  1507 Lipase(!): 68 [CG]  1508 AST(!): 60 [CG]  1508 ALT(!): 54 [CG]  1509 Total Bilirubin(!): 2.5 [CG]  1509 GFR, Est African American(!): 43 [CG]  1509 Creatinine(!): 1.93 [CG]  1509 Hemoglobin(!): 9.7 [CG]  1509 Color, Urine(!): ORANGE [CG]  1509 Hgb urine dipstickMarland Kitchen): MODERATE [CG]  1509 Bilirubin Urine(!): MODERATE [CG]  1509 Ketones, ur(!): 40 [CG]  1509 Protein(!): 100 [CG]  1556 Bilirubin, Direct(!): 1.0 [CG]  1736 IMPRESSION: 1. Increased hepatic echogenicity compatible with hepatic steatosis. No other hepatic abnormalities. 2. No evidence of cholelithiasis, acute cholecystitis or biliary dilatation.  US Abdomen Limited RUQ [CG]  1842 IMPRESSION: 1. Findings are highly suggestive of ascending infection of the RIGHT ureter and associated RIGHT-sided pyelonephritis. Findings could alternatively represent recently passed stone, considered less likely given patient's symptoms. Recommend correlation with urinalysis. 2. Remainder of the abdomen and pelvis CT is unremarkable, as detailed above.  CT ABDOMEN PELVIS W CONTRAST [CG]    Clinical Course User Index [CG] Kinnie Feil, PA-C   57 year old here with 2 weeks of nausea, vomiting, diarrhea, generalized fatigue, right upper chest wall tenderness, fevers, chills. Fever and tachycardia noted however he is well appearing, immunocompetent, so will hold off on sepsis code at this time given clinical exam.  Could be early sepsis. Pending lab work.  Will reassess.  1730: VS have improved. Pt has persistent RUQ/right lateral chest wall tenderness.  Elevated AST, ALT, total and indirect bili, lipase. Moderate hgb and  bilirubin, 40 ketones and 100 protein in urine.  AKI creatinine 1.03. Anemia 9.7 with negative hemoccult, no h/o anemia.  CXR negative. RUQ [place] with hepatic steatosis.  Given initial VS, persistent n/v/d without clear source will obtain CTAP to r/o intraabd etiology, diverticulitis. Right sided chest wall tenderness unlikely to be cardiac or PE given work up and symptomatology.    1845: CT shows findings suggestive of a sending infection in the right ureter and pyelonephritis.  This can explain fevers, chills.  His right lateral rib pain could be referred pain.  Gallbladder looks WNL.  Will give ceftriaxone, send urine culture and consult hospitalist for admission for SIRS/early sepsis likely secondary to GU infection.   1923: spoke to hospitalist who will admit pt to floor. Admit orders pending.  Final Clinical Impressions(s) / ED Diagnoses   Final diagnoses:  RUQ pain  Pyelonephritis  SIRS (systemic inflammatory response syndrome) ([place])  ED Discharge Orders    None       Arlean Hopping 07/25/18 1924    Davonna Belling, MD 07/26/18 [phone]

## 2018-07-25 NOTE — ED Notes (Signed)
Per EDP, pt is having right armpit pain, pt is able to pull self up in bed without difficulty

## 2018-07-25 NOTE — H&P (Signed)
History and Physical    Ko Bardon HCW:[id number] DOB: 1961/02/26 DOA: 07/25/2018  PCP: Patient, No Pcp Per   Patient coming from: Home, by way of [facility]   Chief Complaint: Right flank pain, fevers, N/V/D  HPI: Nicholas Miller is a 57 y.o. male who denies any significant past medical history, though does not see a doctor, now presenting to the emergency department for evaluation of fever, right flank pain, nausea, vomiting, and diarrhea.  Patient reports that the symptoms developed approximately 2 to 3 weeks ago and have slowly worsened.  Reports nonbloody diarrhea "all throughout the day," nausea, and several episodes of nonbloody vomiting.  He has also been experiencing fevers, chills, and pain in the right flank.  Denies dysuria or hematuria.  Denies any chest pain, cough, or shortness of breath.  Reports that he usually has some mild swelling, but none currently.  ED Course: Upon arrival to the ED, patient is found to be febrile to 38.4 C, mildly tachycardic, and with vitals otherwise stable.  EKG features a sinus tachycardia with rate 109 and PVC.  Chest x-ray is negative for acute cardiopulmonary disease.  Right upper quadrant ultrasound is notable for increased hepatic echogenicity consistent with hepatic steatosis.  CT the abdomen and pelvis suggest an ascending infection involving the right ureter and right kidney, less likely secondary to a recently passed stone.  Chemistry panel is notable for sodium of 130, total bilirubin 2.5, and creatinine of 1.93.  CBC features a normocytic anemia with hemoglobin of 9.7.  Lactic acid is normal, troponin undetectable, and fecal occult blood testing negative.  Blood and urine cultures were collected, GI panel was ordered, and the patient was given 2 L normal saline and 1 g of IV Rocephin.  Tachycardia resolved and blood pressure remained stable.  Patient will be admitted for ongoing evaluation and management of suspected pyelonephritis.  Review of Systems:    All other systems reviewed and apart from HPI, are negative.  History reviewed. No pertinent past medical history.  History reviewed. No pertinent surgical history.   reports that he has quit smoking. He has never used smokeless tobacco. He reports that he does not drink alcohol or use drugs.  No Known Allergies  Family History  Problem Relation Age of Onset  . Diabetes Mother      Prior to Admission medications   Medication Sig Start Date End Date Taking? Authorizing Provider  benzonatate (TESSALON) 100 MG capsule Take 1 capsule (100 mg total) by mouth every 8 (eight) hours. 01/18/17   Bettey Costa, PA  dextromethorphan-guaiFENesin (MUCINEX DM) 30-600 MG 12hr tablet Take 1 tablet by mouth 2 (two) times daily as needed for cough. 01/18/17   Bettey Costa, PA  fluticasone (FLONASE) 50 MCG/ACT nasal spray Place 2 sprays into both nostrils daily. 01/18/17   Bettey Costa, [place]    Physical Exam: Vitals:   07/25/18 1900 07/25/18 1930 07/25/18 1957 07/25/18 2113  BP: (!) 97/56 111/67  116/75  Pulse: 88 85  91  Resp: 17 (!) 25  18  Temp:   98.3 F (36.8 C) 98.2 F (36.8 C)  TempSrc:   Oral Oral  SpO2: 99% 100%  100%  Weight:    111.1 kg  Height:    5\' 8"  (1.727 m)      Constitutional: NAD, calm  Eyes: PERTLA, lids and conjunctivae normal ENMT: Mucous membranes are moist. Posterior pharynx clear of any exudate or lesions.   Neck: normal, supple, no masses, no  thyromegaly Respiratory: clear to auscultation bilaterally, no wheezing, no crackles. Normal respiratory effort.    Cardiovascular: S1 & S2 heard, regular rate and rhythm. No extremity edema.   Abdomen: No distension, soft, right CVA tenderness. Bowel sounds normal.  Musculoskeletal: no clubbing / cyanosis. No joint deformity upper and lower extremities.   Skin: no significant rashes, lesions, ulcers. Warm, dry, well-perfused. Neurologic: CN 2-12 grossly intact. Sensation intact. Strength  5/5 in all 4 limbs.  Psychiatric: Alert and oriented x 3. Calm, cooperative.     Labs on Admission: I have personally reviewed following labs and imaging studies  CBC: Recent Labs  Lab 07/25/18 1422  WBC 8.6  NEUTROABS 6.5  HGB 9.7*  HCT 29.6*  MCV 90.8  PLT 812   Basic Metabolic Panel: Recent Labs  Lab 07/25/18 1422  NA 130*  K 3.5  CL 96*  CO2 20*  GLUCOSE 107*  BUN 19  CREATININE 1.93*  CALCIUM 7.8*   GFR: Estimated Creatinine Clearance: 51.1 mL/min (A) (by C-G formula based on SCr of 1.93 mg/dL (H)). Liver Function Tests: Recent Labs  Lab 07/25/18 1422  AST 60*  ALT 54*  ALKPHOS 59  BILITOT 2.5*  PROT 8.0  ALBUMIN 3.1*   Recent Labs  Lab 07/25/18 1422  LIPASE 68*   No results for input(s): AMMONIA in the last 168 hours. Coagulation Profile: No results for input(s): INR, PROTIME in the last 168 hours. Cardiac Enzymes: Recent Labs  Lab 07/25/18 1422  TROPONINI <0.03   BNP (last 3 results) No results for input(s): PROBNP in the last 8760 hours. HbA1C: No results for input(s): HGBA1C in the last 72 hours. CBG: No results for input(s): GLUCAP in the last 168 hours. Lipid Profile: No results for input(s): CHOL, HDL, LDLCALC, TRIG, CHOLHDL, LDLDIRECT in the last 72 hours. Thyroid Function Tests: No results for input(s): TSH, T4TOTAL, FREET4, T3FREE, THYROIDAB in the last 72 hours. Anemia Panel: No results for input(s): VITAMINB12, FOLATE, FERRITIN, TIBC, IRON, RETICCTPCT in the last 72 hours. Urine analysis:    Component Value Date/Time   COLORURINE ORANGE (A) 07/25/2018 1423   APPEARANCEUR CLEAR 07/25/2018 1423   LABSPEC 1.020 07/25/2018 1423   PHURINE 6.0 07/25/2018 1423   GLUCOSEU NEGATIVE 07/25/2018 1423   HGBUR MODERATE (A) 07/25/2018 1423   BILIRUBINUR MODERATE (A) 07/25/2018 1423   KETONESUR 40 (A) 07/25/2018 1423   PROTEINUR 100 (A) 07/25/2018 1423   NITRITE NEGATIVE 07/25/2018 1423   LEUKOCYTESUR NEGATIVE 07/25/2018 1423    Sepsis Labs: @LABRCNTIP (procalcitonin:4,lacticidven:4) )No results found for this or any previous visit (from the past 240 hour(s)).   Radiological Exams on Admission: Dg Chest 2 View  Result Date: 07/25/2018 CLINICAL DATA:  Fever x3 weeks. Former smoker with right upper chest and right arm pain. EXAM: CHEST - 2 VIEW COMPARISON:  01/18/2017 FINDINGS: Top-normal size heart. Slight uncoiling of the nonaneurysmal thoracic aorta. No acute pulmonary consolidation or CHF. No effusion or pneumothorax. No acute osseous abnormality. Degenerative changes are present the dorsal spine with slight depression of the T10 superior endplate since prior. IMPRESSION: No active cardiopulmonary disease. Slightly depressed appearance of the T10 superior endplate since previous exam may reflect a remote mild superior endplate compression fracture. Electronically Signed   By: Ashley Royalty M.D.   On: 07/25/2018 14:58   Dg Shoulder Right  Result Date: 07/25/2018 CLINICAL DATA:  RIGHT shoulder pain for 1 week. EXAM: RIGHT SHOULDER - 2+ VIEW COMPARISON:  None. FINDINGS: The humeral head is well-formed and located. The  subacromial, glenohumeral and acromioclavicular joint spaces are intact. No destructive bony lesions. Soft tissue planes are non-suspicious. IMPRESSION: Negative. Electronically Signed   By: Elon Alas M.D.   On: 07/25/2018 17:19   Ct Abdomen Pelvis W Contrast  Result Date: 07/25/2018 CLINICAL DATA:  RIGHT upper quadrant abdominal pain and fever for 3 weeks. EXAM: CT ABDOMEN AND PELVIS WITH CONTRAST TECHNIQUE: Multidetector CT imaging of the abdomen and pelvis was performed using the standard protocol following bolus administration of intravenous contrast. CONTRAST:  113mL ISOVUE-300 IOPAMIDOL (ISOVUE-300) INJECTION 61% COMPARISON:  None. FINDINGS: Lower chest: No acute abnormality. Hepatobiliary: No focal liver abnormality is seen. No gallstones, gallbladder wall thickening, or biliary dilatation.  Pancreas: Unremarkable. No pancreatic ductal dilatation or surrounding inflammatory changes. Spleen: Normal in size without focal abnormality. Adrenals/Urinary Tract: Ill-defined edema/inflammation within the RIGHT perinephric space. Subtle heterogeneity of the RIGHT renal cortex suggests associated cortical edema. Additional mild inflammation/fluid stranding about the RIGHT ureter, suggesting ascending infection. Bladder is unremarkable, partially decompressed. LEFT kidney appears normal. No LEFT-sided perinephric edema. LEFT ureter appears normal. No ureteral or bladder calculi identified. Stomach/Bowel: No dilated large or small bowel loops. No bowel wall thickening or evidence of bowel wall inflammation seen. Appendix is normal. Stomach appears normal, partially decompressed. Vascular/Lymphatic: No significant vascular findings are present. No enlarged abdominal or pelvic lymph nodes. Reproductive: Prostate is unremarkable. Other: No abscess collection seen.  No free intraperitoneal air. Musculoskeletal: No acute or suspicious osseous finding. IMPRESSION: 1. Findings are highly suggestive of ascending infection of the RIGHT ureter and associated RIGHT-sided pyelonephritis. Findings could alternatively represent recently passed stone, considered less likely given patient's symptoms. Recommend correlation with urinalysis. 2. Remainder of the abdomen and pelvis CT is unremarkable, as detailed above. Electronically Signed   By: Franki Cabot M.D.   On: 07/25/2018 18:34   US Abdomen Limited Ruq  Result Date: 07/25/2018 CLINICAL DATA:  57 year old male with acute RIGHT UPPER quadrant abdominal pain and fever for 3 weeks. EXAM: ULTRASOUND ABDOMEN LIMITED RIGHT UPPER QUADRANT COMPARISON:  None. FINDINGS: Gallbladder: The gallbladder is unremarkable. There is no evidence of cholelithiasis or acute cholecystitis. Common bile duct: Diameter: 2.7 mm. No evidence of intrahepatic or extrahepatic biliary dilatation. Liver:  Diffusely increased echogenicity noted. No definite focal hepatic abnormality. Portal vein is patent on color Doppler imaging with normal direction of blood flow towards the liver. IMPRESSION: 1. Increased hepatic echogenicity compatible with hepatic steatosis. No other hepatic abnormalities. 2. No evidence of cholelithiasis, acute cholecystitis or biliary dilatation. Electronically Signed   By: Margarette Canada M.D.   On: 07/25/2018 16:59    EKG: Independently reviewed. Sinus tachycardia (rate 109).   Assessment/Plan   1. Pyelonephritis  - Presents with right flank pain, N/V/D  - Found to have fever and CT abd/pelvis findings suggestive of right-sided pyelonephritis  - Blood and urine cultures collected in ED and empiric Rocpehin given   - Continue Rocephin while following cultures and clinical course   2. Renal insufficiency  - SCr is 1.93 on admission with no prior labs available  - Likely an acute element in setting of N/V/D  - No obstruction on CT abd/pelvis  - Fluid-resuscitated in ED with 2 liters  - Renally-dose medications, check urine chemistries, continue IVF hydration, and repeat chem panel in am    3. Diarrhea  - Reports 2 weeks of non-bloody diarrhea  - CT abd/pelvis suggests right-sided pyelo, no other acute abnormality  - GI pathogen panel was sent from ED, will continue enteric  precautions   4. Normocytic anemia  - Hgb is 9.7 on admission with no priors available  - Patient denies melena or hematochezia and FOBT is negative - Outpatient follow-up recommended    5. Hyponatremia  - Serum sodium is 130 on admission  - Likely secondary to hypovolemia in setting of N/V/D  - Fluid-resuscitated in ED with 2 liters IVF, continued on NS infusion  - Repeat chem panel in am    6. Elevated LFT's  - Total bilirubin 2.5 and transaminases slightly elevated  - Abd exam benign  - [place] suggests steatosis    DVT prophylaxis: SCD's  Code Status: Full  Family Communication: Wife  updated at bedside Consults called: none  Admission status: Inpatient     Vianne Bulls, MD [facility] Pager [phone]  If 7PM-7AM, please contact night-coverage [email or web address] Password [facility]  07/25/2018, 9:52 PM

## 2018-07-25 NOTE — ED Notes (Signed)
Pt returned from CT °

## 2018-07-26 DIAGNOSIS — N12 Tubulo-interstitial nephritis, not specified as acute or chronic: Secondary | ICD-10-CM | POA: Diagnosis not present

## 2018-07-26 LAB — GASTROINTESTINAL PANEL BY PCR, STOOL (REPLACES STOOL CULTURE)
ADENOVIRUS F40/41: NOT DETECTED
Astrovirus: NOT DETECTED
CRYPTOSPORIDIUM: NOT DETECTED
CYCLOSPORA CAYETANENSIS: NOT DETECTED
Campylobacter species: NOT DETECTED
ENTEROAGGREGATIVE E COLI (EAEC): NOT DETECTED
Entamoeba histolytica: NOT DETECTED
Enteropathogenic E coli (EPEC): NOT DETECTED
Enterotoxigenic E coli (ETEC): NOT DETECTED
GIARDIA LAMBLIA: NOT DETECTED
Norovirus GI/GII: NOT DETECTED
PLESIMONAS SHIGELLOIDES: NOT DETECTED
Rotavirus A: NOT DETECTED
SALMONELLA SPECIES: NOT DETECTED
SHIGELLA/ENTEROINVASIVE E COLI (EIEC): NOT DETECTED
Sapovirus (I, II, IV, and V): NOT DETECTED
Shiga like toxin producing E coli (STEC): NOT DETECTED
VIBRIO SPECIES: NOT DETECTED
Vibrio cholerae: NOT DETECTED
YERSINIA ENTEROCOLITICA: NOT DETECTED

## 2018-07-26 LAB — SODIUM, URINE, RANDOM: Sodium, Ur: 57 mmol/L

## 2018-07-26 LAB — BASIC METABOLIC PANEL
ANION GAP: 8 (ref 5–15)
BUN: 12 mg/dL (ref 6–20)
CO2: 22 mmol/L (ref 22–32)
Calcium: 7.9 mg/dL — ABNORMAL LOW (ref 8.9–10.3)
Chloride: 103 mmol/L (ref 98–111)
Creatinine, Ser: 1.4 mg/dL — ABNORMAL HIGH (ref 0.61–1.24)
GFR, EST NON AFRICAN AMERICAN: 54 mL/min — AB (ref >60)
GLUCOSE: 121 mg/dL — AB (ref 70–99)
POTASSIUM: 3.6 mmol/L (ref 3.5–5.1)
Sodium: 133 mmol/L — ABNORMAL LOW (ref 135–145)

## 2018-07-26 LAB — COMPREHENSIVE METABOLIC PANEL
ALBUMIN: 2.4 g/dL — AB (ref 3.5–5.0)
ALK PHOS: 48 U/L (ref 38–126)
ALT: 52 U/L — ABNORMAL HIGH (ref 0–44)
AST: 52 U/L — AB (ref 15–41)
Anion gap: 8 (ref 5–15)
BILIRUBIN TOTAL: 1.7 mg/dL — AB (ref 0.3–1.2)
BUN: 13 mg/dL (ref 6–20)
CALCIUM: 7.8 mg/dL — AB (ref 8.9–10.3)
CO2: 23 mmol/L (ref 22–32)
Chloride: 104 mmol/L (ref 98–111)
Creatinine, Ser: 1.48 mg/dL — ABNORMAL HIGH (ref 0.61–1.24)
GFR calc Af Amer: 59 mL/min — ABNORMAL LOW (ref >60)
GFR calc non Af Amer: 51 mL/min — ABNORMAL LOW (ref >60)
GLUCOSE: 102 mg/dL — AB (ref 70–99)
Potassium: 3.5 mmol/L (ref 3.5–5.1)
Sodium: 135 mmol/L (ref 135–145)
TOTAL PROTEIN: 6.3 g/dL — AB (ref 6.5–8.1)

## 2018-07-26 LAB — CBC WITH DIFFERENTIAL/PLATELET
Abs Immature Granulocytes: 0 10*3/uL (ref 0.0–0.1)
Basophils Absolute: 0 10*3/uL (ref 0.0–0.1)
Basophils Relative: 0 %
Eosinophils Absolute: 0 10*3/uL (ref 0.0–0.7)
Eosinophils Relative: 1 %
HEMATOCRIT: 25.7 % — AB (ref 39.0–52.0)
HEMOGLOBIN: 7.9 g/dL — AB (ref 13.0–17.0)
IMMATURE GRANULOCYTES: 0 %
LYMPHS ABS: 0.8 10*3/uL (ref 0.7–4.0)
Lymphocytes Relative: 13 %
MCH: 28.9 pg (ref 26.0–34.0)
MCHC: 30.7 g/dL (ref 30.0–36.0)
MCV: 94.1 fL (ref 78.0–100.0)
Monocytes Absolute: 1 10*3/uL (ref 0.1–1.0)
Monocytes Relative: 16 %
NEUTROS PCT: 70 %
Neutro Abs: 4.3 10*3/uL (ref 1.7–7.7)
Platelets: 246 10*3/uL (ref 150–400)
RBC: 2.73 MIL/uL — AB (ref 4.22–5.81)
RDW: 11.9 % (ref 11.5–15.5)
WBC: 6.1 10*3/uL (ref 4.0–10.5)

## 2018-07-26 LAB — URINE CULTURE: Culture: NO GROWTH

## 2018-07-26 LAB — HIV ANTIBODY (ROUTINE TESTING W REFLEX): HIV Screen 4th Generation wRfx: NONREACTIVE

## 2018-07-26 LAB — HEPATITIS PANEL, ACUTE
HCV Ab: 0.2 s/co ratio (ref 0.0–0.9)
HEP B S AG: NEGATIVE
Hep A IgM: NEGATIVE
Hep B C IgM: NEGATIVE

## 2018-07-26 LAB — TYPE AND SCREEN
ABO/RH(D): B POS
ANTIBODY SCREEN: NEGATIVE

## 2018-07-26 LAB — ABO/RH: ABO/RH(D): B POS

## 2018-07-26 LAB — OCCULT BLOOD X 1 CARD TO LAB, STOOL: FECAL OCCULT BLD: POSITIVE — AB

## 2018-07-26 LAB — CREATININE, URINE, RANDOM: Creatinine, Urine: 128.54 mg/dL

## 2018-07-26 MED ORDER — SODIUM CHLORIDE 0.9 % IV SOLN
INTRAVENOUS | Status: DC
  Administered 2018-07-26 – 2018-07-28 (×3): via INTRAVENOUS

## 2018-07-26 NOTE — Progress Notes (Signed)
PROGRESS NOTE    Nicholas Miller  RXV:[id number] DOB: 1961-11-03 DOA: 07/25/2018 PCP: Patient, No Pcp Per   Brief Narrative: Patient is a 57 year old male with no significant past medical history presented to the emergency department for the evaluation of fever, right flank pain, nausea, vomiting and diarrhea.  CT imaging done in the emergency department was suggestive of right-sided pyelonephritis versus recently passed stone.  Patient was admitted for the management of right-sided pyelonephritis with IV antibiotics.  Assessment & Plan:   Principal Problem:   Pyelonephritis Active Problems:   Hyponatremia   Renal insufficiency   Hyperbilirubinemia   Normocytic anemia   Diarrhea with dehydration  Right-sided pyelonephritis: Questionable diagnosis.  UA not suggestive of frank UTI.  Urine culture did not show any growth.  We will continue ceftriaxone for now.  CT finding could be most likely associated with with recently passed stone.  Patient is afebrile, hemodynamically stable now.  He does not complain of any lower urinary tract symptoms.  Renal insufficiency: Creatinine was 1.9 on admission.  Previous baseline creatinine not known.  Continue IV fluids.  Diarrhea: Resolved.  GI pathogen blood negative  Normocytic anemia: Hemoglobin dropped to 7.9 today.  Etiology remains unknown.  We will follow-up iron studies.  Might need GI follow-up as an outpatient for possible EGD/colonoscopy.  Will check FOBT.  Hyponatremia: Much improved  Elevated LFTs: Most likely associated with hepatic steatosis.  Ultrasound of the liver suggestive of hepatic stenosis.  Hepatitis panel negative.   DVT prophylaxis: SCD Code Status: Full Family Communication: None present at the bed side Disposition Plan: Home tomorrow   Consultants: None  Procedures:None   Antimicrobials: None  Subjective: Patient seen and examined the bedside this morning.  Remains comfortable.  Hemodynamically stable.  Afebrile.   Did not complain of any abdominal pain.  No nausea or vomiting.  Diarrhea resolved  Objective: Vitals:   07/25/18 2113 07/26/18 0500 07/26/18 0615 07/26/18 0900  BP: 116/75  112/65 120/69  Pulse: 91  89 90  Resp: 18  19 20   Temp: 98.2 F (36.8 C)  98.7 F (37.1 C) 98.6 F (37 C)  TempSrc: Oral  Oral Oral  SpO2: 100%  100%   Weight: 111.1 kg 111.1 kg    Height: 5\' 8"  (1.727 m)       Intake/Output Summary (Last 24 hours) at 07/26/2018 1433 Last data filed at 07/26/2018 1354 Gross per 24 hour  Intake 2962.65 ml  Output 0 ml  Net 2962.65 ml   Filed Weights   07/25/18 1358 07/25/18 2113 07/26/18 0500  Weight: 111.1 kg 111.1 kg 111.1 kg    Examination:  General exam: Appears calm and comfortable ,Not in distress,average built HEENT:PERRL,Oral mucosa moist, Ear/Nose normal on gross exam Respiratory system: Bilateral equal air entry, normal vesicular breath sounds, no wheezes or crackles  Cardiovascular system: S1 & S2 heard, RRR. No JVD, murmurs, rubs, gallops or clicks. No pedal edema. Gastrointestinal system: Abdomen is nondistended, soft and nontender. No organomegaly or masses felt. Normal bowel sounds heard. Central nervous system: Alert and oriented. No focal neurological deficits. Extremities: No edema, no clubbing ,no cyanosis, distal peripheral pulses palpable. Skin: No rashes, lesions or ulcers,no icterus ,no pallor MSK: Normal muscle bulk,tone ,power, mild right-sided costovertebral angle tenderness Psychiatry: Judgement and insight appear normal. Mood & affect appropriate.     Data Reviewed: I have personally reviewed following labs and imaging studies  CBC: Recent Labs  Lab 07/25/18 1422 07/26/18 0626  WBC 8.6 6.1  NEUTROABS 6.5 4.3  HGB 9.7* 7.9*  HCT 29.6* 25.7*  MCV 90.8 94.1  PLT 286 786   Basic Metabolic Panel: Recent Labs  Lab 07/25/18 1422 07/26/18 0626  NA 130* 135  K 3.5 3.5  CL 96* 104  CO2 20* 23  GLUCOSE 107* 102*  BUN 19 13    CREATININE 1.93* 1.48*  CALCIUM 7.8* 7.8*   GFR: Estimated Creatinine Clearance: 66.6 mL/min (A) (by C-G formula based on SCr of 1.48 mg/dL (H)). Liver Function Tests: Recent Labs  Lab 07/25/18 1422 07/26/18 0626  AST 60* 52*  ALT 54* 52*  ALKPHOS 59 48  BILITOT 2.5* 1.7*  PROT 8.0 6.3*  ALBUMIN 3.1* 2.4*   Recent Labs  Lab 07/25/18 1422  LIPASE 68*   No results for input(s): AMMONIA in the last 168 hours. Coagulation Profile: No results for input(s): INR, PROTIME in the last 168 hours. Cardiac Enzymes: Recent Labs  Lab 07/25/18 1422  TROPONINI <0.03   BNP (last 3 results) No results for input(s): PROBNP in the last 8760 hours. HbA1C: No results for input(s): HGBA1C in the last 72 hours. CBG: No results for input(s): GLUCAP in the last 168 hours. Lipid Profile: No results for input(s): CHOL, HDL, LDLCALC, TRIG, CHOLHDL, LDLDIRECT in the last 72 hours. Thyroid Function Tests: No results for input(s): TSH, T4TOTAL, FREET4, T3FREE, THYROIDAB in the last 72 hours. Anemia Panel: No results for input(s): VITAMINB12, FOLATE, FERRITIN, TIBC, IRON, RETICCTPCT in the last 72 hours. Sepsis Labs: Recent Labs  Lab 07/25/18 1432  LATICACIDVEN 1.43    Recent Results (from the past 240 hour(s))  Urine culture     Status: None   Collection Time: 07/25/18  2:23 PM  Result Value Ref Range Status   Specimen Description   Final    URINE, CLEAN CATCH Performed at [facility], [address] [place], [place] [address]    Special Requests   Final    NONE Performed at [facility], [address] [place], [place] [address]    Culture   Final    NO GROWTH Performed at [facility], [place] [address], [place], [place] [address]    Report Status 07/26/2018 FINAL  Final  Gastrointestinal Panel by PCR , Stool     Status: None   Collection Time: 07/25/18  3:25 PM  Result Value Ref Range Status   Campylobacter species NOT DETECTED NOT DETECTED Final    Plesimonas shigelloides NOT DETECTED NOT DETECTED Final   Salmonella species NOT DETECTED NOT DETECTED Final   Yersinia enterocolitica NOT DETECTED NOT DETECTED Final   Vibrio species NOT DETECTED NOT DETECTED Final   Vibrio cholerae NOT DETECTED NOT DETECTED Final   Enteroaggregative E coli (EAEC) NOT DETECTED NOT DETECTED Final   Enteropathogenic E coli (EPEC) NOT DETECTED NOT DETECTED Final   Enterotoxigenic E coli (ETEC) NOT DETECTED NOT DETECTED Final   Shiga like toxin producing E coli (STEC) NOT DETECTED NOT DETECTED Final   Shigella/Enteroinvasive E coli (EIEC) NOT DETECTED NOT DETECTED Final   Cryptosporidium NOT DETECTED NOT DETECTED Final   Cyclospora cayetanensis NOT DETECTED NOT DETECTED Final   Entamoeba histolytica NOT DETECTED NOT DETECTED Final   Giardia lamblia NOT DETECTED NOT DETECTED Final   Adenovirus F40/41 NOT DETECTED NOT DETECTED Final   Astrovirus NOT DETECTED NOT DETECTED Final   Norovirus GI/GII NOT DETECTED NOT DETECTED Final   Rotavirus A NOT DETECTED NOT DETECTED Final   Sapovirus (  I, II, IV, and V) NOT DETECTED NOT DETECTED Final    Comment: Performed at [facility], [address], [place], [place] [address]         Radiology Studies: Dg Chest 2 View  Result Date: 07/25/2018 CLINICAL DATA:  Fever x3 weeks. Former smoker with right upper chest and right arm pain. EXAM: CHEST - 2 VIEW COMPARISON:  01/18/2017 FINDINGS: Top-normal size heart. Slight uncoiling of the nonaneurysmal thoracic aorta. No acute pulmonary consolidation or CHF. No effusion or pneumothorax. No acute osseous abnormality. Degenerative changes are present the dorsal spine with slight depression of the T10 superior endplate since prior. IMPRESSION: No active cardiopulmonary disease. Slightly depressed appearance of the T10 superior endplate since previous exam may reflect a remote mild superior endplate compression fracture. Electronically Signed   By: Ashley Royalty M.D.   On:  07/25/2018 14:58   Dg Shoulder Right  Result Date: 07/25/2018 CLINICAL DATA:  RIGHT shoulder pain for 1 week. EXAM: RIGHT SHOULDER - 2+ VIEW COMPARISON:  None. FINDINGS: The humeral head is well-formed and located. The subacromial, glenohumeral and acromioclavicular joint spaces are intact. No destructive bony lesions. Soft tissue planes are non-suspicious. IMPRESSION: Negative. Electronically Signed   By: Elon Alas M.D.   On: 07/25/2018 17:19   Ct Abdomen Pelvis W Contrast  Result Date: 07/25/2018 CLINICAL DATA:  RIGHT upper quadrant abdominal pain and fever for 3 weeks. EXAM: CT ABDOMEN AND PELVIS WITH CONTRAST TECHNIQUE: Multidetector CT imaging of the abdomen and pelvis was performed using the standard protocol following bolus administration of intravenous contrast. CONTRAST:  142mL ISOVUE-300 IOPAMIDOL (ISOVUE-300) INJECTION 61% COMPARISON:  None. FINDINGS: Lower chest: No acute abnormality. Hepatobiliary: No focal liver abnormality is seen. No gallstones, gallbladder wall thickening, or biliary dilatation. Pancreas: Unremarkable. No pancreatic ductal dilatation or surrounding inflammatory changes. Spleen: Normal in size without focal abnormality. Adrenals/Urinary Tract: Ill-defined edema/inflammation within the RIGHT perinephric space. Subtle heterogeneity of the RIGHT renal cortex suggests associated cortical edema. Additional mild inflammation/fluid stranding about the RIGHT ureter, suggesting ascending infection. Bladder is unremarkable, partially decompressed. LEFT kidney appears normal. No LEFT-sided perinephric edema. LEFT ureter appears normal. No ureteral or bladder calculi identified. Stomach/Bowel: No dilated large or small bowel loops. No bowel wall thickening or evidence of bowel wall inflammation seen. Appendix is normal. Stomach appears normal, partially decompressed. Vascular/Lymphatic: No significant vascular findings are present. No enlarged abdominal or pelvic lymph nodes.  Reproductive: Prostate is unremarkable. Other: No abscess collection seen.  No free intraperitoneal air. Musculoskeletal: No acute or suspicious osseous finding. IMPRESSION: 1. Findings are highly suggestive of ascending infection of the RIGHT ureter and associated RIGHT-sided pyelonephritis. Findings could alternatively represent recently passed stone, considered less likely given patient's symptoms. Recommend correlation with urinalysis. 2. Remainder of the abdomen and pelvis CT is unremarkable, as detailed above. Electronically Signed   By: Franki Cabot M.D.   On: 07/25/2018 18:34   US Abdomen Limited Ruq  Result Date: 07/25/2018 CLINICAL DATA:  57 year old male with acute RIGHT UPPER quadrant abdominal pain and fever for 3 weeks. EXAM: ULTRASOUND ABDOMEN LIMITED RIGHT UPPER QUADRANT COMPARISON:  None. FINDINGS: Gallbladder: The gallbladder is unremarkable. There is no evidence of cholelithiasis or acute cholecystitis. Common bile duct: Diameter: 2.7 mm. No evidence of intrahepatic or extrahepatic biliary dilatation. Liver: Diffusely increased echogenicity noted. No definite focal hepatic abnormality. Portal vein is patent on color Doppler imaging with normal direction of blood flow towards the liver. IMPRESSION: 1. Increased hepatic echogenicity compatible with hepatic steatosis.  No other hepatic abnormalities. 2. No evidence of cholelithiasis, acute cholecystitis or biliary dilatation. Electronically Signed   By: Margarette Canada M.D.   On: 07/25/2018 16:59        Scheduled Meds: Continuous Infusions: . cefTRIAXone (ROCEPHIN)  IV       LOS: 1 day    Time spent: 25 mins.More than 50% of that time was spent in counseling and/or coordination of care.      Shelly Coss, MD [facility] Pager [phone]  If 7PM-7AM, please contact night-coverage [email or web address] Password [facility] 07/26/2018, 2:33 PM

## 2018-07-27 ENCOUNTER — Encounter (HOSPITAL_COMMUNITY): Payer: Self-pay | Admitting: Anesthesiology

## 2018-07-27 DIAGNOSIS — N12 Tubulo-interstitial nephritis, not specified as acute or chronic: Secondary | ICD-10-CM | POA: Diagnosis not present

## 2018-07-27 LAB — IRON AND TIBC
Iron: 34 ug/dL — ABNORMAL LOW (ref 45–182)
SATURATION RATIOS: 24 % (ref 17.9–39.5)
TIBC: 143 ug/dL — AB (ref 250–450)
UIBC: 109 ug/dL

## 2018-07-27 LAB — CBC WITH DIFFERENTIAL/PLATELET
Abs Immature Granulocytes: 0 10*3/uL (ref 0.0–0.1)
BASOS PCT: 1 %
Basophils Absolute: 0 10*3/uL (ref 0.0–0.1)
Eosinophils Absolute: 0.1 10*3/uL (ref 0.0–0.7)
Eosinophils Relative: 2 %
HEMATOCRIT: 24.2 % — AB (ref 39.0–52.0)
Hemoglobin: 7.5 g/dL — ABNORMAL LOW (ref 13.0–17.0)
Immature Granulocytes: 1 %
LYMPHS ABS: 0.7 10*3/uL (ref 0.7–4.0)
Lymphocytes Relative: 16 %
MCH: 29.1 pg (ref 26.0–34.0)
MCHC: 31 g/dL (ref 30.0–36.0)
MCV: 93.8 fL (ref 78.0–100.0)
MONO ABS: 0.9 10*3/uL (ref 0.1–1.0)
MONOS PCT: 20 %
NEUTROS ABS: 2.6 10*3/uL (ref 1.7–7.7)
Neutrophils Relative %: 60 %
PLATELETS: 274 10*3/uL (ref 150–400)
RBC: 2.58 MIL/uL — ABNORMAL LOW (ref 4.22–5.81)
RDW: 11.9 % (ref 11.5–15.5)
WBC: 4.3 10*3/uL (ref 4.0–10.5)

## 2018-07-27 LAB — BASIC METABOLIC PANEL
Anion gap: 8 (ref 5–15)
BUN: 10 mg/dL (ref 6–20)
CALCIUM: 8 mg/dL — AB (ref 8.9–10.3)
CHLORIDE: 106 mmol/L (ref 98–111)
CO2: 22 mmol/L (ref 22–32)
CREATININE: 1.19 mg/dL (ref 0.61–1.24)
GFR calc Af Amer: >60 mL/min (ref >60)
GFR calc non Af Amer: >60 mL/min (ref >60)
Glucose, Bld: 106 mg/dL — ABNORMAL HIGH (ref 70–99)
Potassium: 3.3 mmol/L — ABNORMAL LOW (ref 3.5–5.1)
Sodium: 136 mmol/L (ref 135–145)

## 2018-07-27 LAB — UREA NITROGEN, URINE: UREA NITROGEN UR: 543 mg/dL

## 2018-07-27 LAB — FERRITIN: FERRITIN: 1264 ng/mL — AB (ref 24–336)

## 2018-07-27 MED ORDER — FERROUS SULFATE 325 (65 FE) MG PO TABS: 325.0000 mg | ORAL_TABLET | Freq: Two times a day (BID) | ORAL | Status: DC

## 2018-07-27 MED ORDER — POTASSIUM CHLORIDE CRYS ER 20 MEQ PO TBCR
40.0000 meq | EXTENDED_RELEASE_TABLET | Freq: Once | ORAL | Status: AC
  Administered 2018-07-27: 40 meq via ORAL
  Filled 2018-07-27: qty 2

## 2018-07-27 NOTE — Progress Notes (Signed)
PROGRESS NOTE    Nicholas Miller  QQP:[id number] DOB: 1961/07/07 DOA: 07/25/2018 PCP: Patient, No Pcp Per   Brief Narrative: Patient is a 57 year old male with no significant past medical history presented to the emergency department for the evaluation of fever, right flank pain, nausea, vomiting and diarrhea.  CT imaging done in the emergency department was suggestive of right-sided pyelonephritis versus recently passed stone.  Patient was admitted for the management of right-sided pyelonephritis with IV antibiotics.  He was also found to have severe normocytic anemia.  FOBT came out to be positive.  GI consulted for possible EGD and coloscopy for further work-up.  Assessment & Plan:   Principal Problem:   Pyelonephritis Active Problems:   Hyponatremia   Renal insufficiency   Hyperbilirubinemia   Normocytic anemia   Diarrhea with dehydration  Right-sided pyelonephritis: Questionable diagnosis.  UA not suggestive of frank UTI.  Urine culture did not show any growth. DC ceftriaxone.  CT finding could be most likely associated with with recently passed stone.  Patient is afebrile, hemodynamically stable now.  He does not complain of any lower urinary tract symptoms.  Renal insufficiency: Resolved with IV fluids.  Diarrhea: Resolved.  GI pathogen blood negative  Normocytic anemia: Hemoglobin dropped to 7.5 today.  Etiology remains unknown.  Iron studies suggest low iron.FOBT positive.GI consulted for  possible EGD/colonoscopy.  No history of EGD/colonoscopy in the past.  Hyponatremia: Much improved  Elevated LFTs: Most likely associated with hepatic steatosis.  Ultrasound of the liver suggestive of hepatic stenosis.  Hepatitis panel negative.   DVT prophylaxis: SCD Code Status: Full Family Communication: None present at the bed side Disposition Plan: Home after GI workup   Consultants: None  Procedures:None   Antimicrobials: None  Subjective: Patient seen and examined the  bedside this morning.  Remains comfortable.  Hemodynamically stable.  Afebrile.  Did not complain of any abdominal pain.  No nausea or vomiting.  Diarrhea resolved  Objective: Vitals:   07/26/18 1736 07/26/18 2047 07/27/18 0502 07/27/18 0844  BP: 120/71 121/69 112/68 108/70  Pulse: 95 91 89 83  Resp: (!) 25 18 18  (!) 23  Temp: 98.6 F (37 C) (!) 101.3 F (38.5 C) 99 F (37.2 C) 99 F (37.2 C)  TempSrc: Oral Oral Oral Oral  SpO2: 99% 98% 99% 100%  Weight:      Height:        Intake/Output Summary (Last 24 hours) at 07/27/2018 1345 Last data filed at 07/27/2018 1119 Gross per 24 hour  Intake 2056.34 ml  Output 1675 ml  Net 381.34 ml   Filed Weights   07/25/18 1358 07/25/18 2113 07/26/18 0500  Weight: 111.1 kg 111.1 kg 111.1 kg    Examination:  General exam: Appears calm and comfortable ,Not in distress,average built HEENT:PERRL,Oral mucosa moist, Ear/Nose normal on gross exam Respiratory system: Bilateral equal air entry, normal vesicular breath sounds, no wheezes or crackles  Cardiovascular system: S1 & S2 heard, RRR. No JVD, murmurs, rubs, gallops or clicks. No pedal edema. Gastrointestinal system: Abdomen is nondistended, soft and nontender. No organomegaly or masses felt. Normal bowel sounds heard. Central nervous system: Alert and oriented. No focal neurological deficits. Extremities: No edema, no clubbing ,no cyanosis, distal peripheral pulses palpable. Skin: No rashes, lesions or ulcers,no icterus ,no pallor MSK: Normal muscle bulk,tone ,power, mild right-sided costovertebral angle tenderness Psychiatry: Judgement and insight appear normal. Mood & affect appropriate.     Data Reviewed: I have personally reviewed following labs and imaging studies  CBC: Recent Labs  Lab 07/25/18 1422 07/26/18 0626 07/27/18 0656  WBC 8.6 6.1 4.3  NEUTROABS 6.5 4.3 2.6  HGB 9.7* 7.9* 7.5*  HCT 29.6* 25.7* 24.2*  MCV 90.8 94.1 93.8  PLT 286 246 527   Basic Metabolic  Panel: Recent Labs  Lab 07/25/18 1422 07/26/18 0626 07/26/18 1441 07/27/18 0656  NA 130* 135 133* 136  K 3.5 3.5 3.6 3.3*  CL 96* 104 103 106  CO2 20* 23 22 22   GLUCOSE 107* 102* 121* 106*  BUN 19 13 12 10   CREATININE 1.93* 1.48* 1.40* 1.19  CALCIUM 7.8* 7.8* 7.9* 8.0*   GFR: Estimated Creatinine Clearance: 82.8 mL/min (by C-G formula based on SCr of 1.19 mg/dL). Liver Function Tests: Recent Labs  Lab 07/25/18 1422 07/26/18 0626  AST 60* 52*  ALT 54* 52*  ALKPHOS 59 48  BILITOT 2.5* 1.7*  PROT 8.0 6.3*  ALBUMIN 3.1* 2.4*   Recent Labs  Lab 07/25/18 1422  LIPASE 68*   No results for input(s): AMMONIA in the last 168 hours. Coagulation Profile: No results for input(s): INR, PROTIME in the last 168 hours. Cardiac Enzymes: Recent Labs  Lab 07/25/18 1422  TROPONINI <0.03   BNP (last 3 results) No results for input(s): PROBNP in the last 8760 hours. HbA1C: No results for input(s): HGBA1C in the last 72 hours. CBG: No results for input(s): GLUCAP in the last 168 hours. Lipid Profile: No results for input(s): CHOL, HDL, LDLCALC, TRIG, CHOLHDL, LDLDIRECT in the last 72 hours. Thyroid Function Tests: No results for input(s): TSH, T4TOTAL, FREET4, T3FREE, THYROIDAB in the last 72 hours. Anemia Panel: Recent Labs    07/27/18 0656  FERRITIN 1,264*  TIBC 143*  IRON 34*   Sepsis Labs: Recent Labs  Lab 07/25/18 1432  LATICACIDVEN 1.43    Recent Results (from the past 240 hour(s))  Blood culture (routine x 2)     Status: None (Preliminary result)   Collection Time: 07/25/18  2:23 PM  Result Value Ref Range Status   Specimen Description   Final    BLOOD RIGHT ANTECUBITAL Performed at [facility], [address] [place], [place] [address]    Special Requests   Final    BOTTLES DRAWN AEROBIC AND ANAEROBIC Blood Culture adequate volume Performed at [facility], [address] [place], [place] [address]    Culture   Final    NO  GROWTH < 24 HOURS Performed at [facility], [place] [address], [place], [place] [address]    Report Status PENDING  Incomplete  Urine culture     Status: None   Collection Time: 07/25/18  2:23 PM  Result Value Ref Range Status   Specimen Description   Final    URINE, CLEAN CATCH Performed at [facility], [address] [place], [place] [address]    Special Requests   Final    NONE Performed at [facility], [address] [place], [place] [address]    Culture   Final    NO GROWTH Performed at [facility], [place] [address] [place], [place] [address]    Report Status 07/26/2018 FINAL  Final  Blood culture (routine x 2)     Status: None (Preliminary result)   Collection Time: 07/25/18  2:28 PM  Result Value Ref Range Status   Specimen Description   Final    BLOOD LEFT ANTECUBITAL Performed at [facility]  [address] [place] [place], [place] [address]    Special Requests   Final    BOTTLES DRAWN AEROBIC AND ANAEROBIC Blood Culture adequate volume Performed at [facility], [address] [place], [place] [address]    Culture   Final    NO GROWTH < 24 HOURS Performed at [facility], [place] [address], [place], [place] [address]    Report Status PENDING  Incomplete  Gastrointestinal Panel by PCR , Stool     Status: None   Collection Time: 07/25/18  3:25 PM  Result Value Ref Range Status   Campylobacter species NOT DETECTED NOT DETECTED Final   Plesimonas shigelloides NOT DETECTED NOT DETECTED Final   Salmonella species NOT DETECTED NOT DETECTED Final   Yersinia enterocolitica NOT DETECTED NOT DETECTED Final   Vibrio species NOT DETECTED NOT DETECTED Final   Vibrio cholerae NOT DETECTED NOT DETECTED Final   Enteroaggregative E coli (EAEC) NOT DETECTED NOT DETECTED Final   Enteropathogenic E coli (EPEC) NOT DETECTED NOT DETECTED Final   Enterotoxigenic E coli (ETEC) NOT DETECTED NOT DETECTED Final   Shiga like toxin  producing E coli (STEC) NOT DETECTED NOT DETECTED Final   Shigella/Enteroinvasive E coli (EIEC) NOT DETECTED NOT DETECTED Final   Cryptosporidium NOT DETECTED NOT DETECTED Final   Cyclospora cayetanensis NOT DETECTED NOT DETECTED Final   Entamoeba histolytica NOT DETECTED NOT DETECTED Final   Giardia lamblia NOT DETECTED NOT DETECTED Final   Adenovirus F40/41 NOT DETECTED NOT DETECTED Final   Astrovirus NOT DETECTED NOT DETECTED Final   Norovirus GI/GII NOT DETECTED NOT DETECTED Final   Rotavirus A NOT DETECTED NOT DETECTED Final   Sapovirus (I, II, IV, and V) NOT DETECTED NOT DETECTED Final    Comment: Performed at [facility], [address], [place], [place] [address]         Radiology Studies: Dg Chest 2 View  Result Date: 07/25/2018 CLINICAL DATA:  Fever x3 weeks. Former smoker with right upper chest and right arm pain. EXAM: CHEST - 2 VIEW COMPARISON:  01/18/2017 FINDINGS: Top-normal size heart. Slight uncoiling of the nonaneurysmal thoracic aorta. No acute pulmonary consolidation or CHF. No effusion or pneumothorax. No acute osseous abnormality. Degenerative changes are present the dorsal spine with slight depression of the T10 superior endplate since prior. IMPRESSION: No active cardiopulmonary disease. Slightly depressed appearance of the T10 superior endplate since previous exam may reflect a remote mild superior endplate compression fracture. Electronically Signed   By: Ashley Royalty M.D.   On: 07/25/2018 14:58   Dg Shoulder Right  Result Date: 07/25/2018 CLINICAL DATA:  RIGHT shoulder pain for 1 week. EXAM: RIGHT SHOULDER - 2+ VIEW COMPARISON:  None. FINDINGS: The humeral head is well-formed and located. The subacromial, glenohumeral and acromioclavicular joint spaces are intact. No destructive bony lesions. Soft tissue planes are non-suspicious. IMPRESSION: Negative. Electronically Signed   By: Elon Alas M.D.   On: 07/25/2018 17:19   Ct Abdomen Pelvis W  Contrast  Result Date: 07/25/2018 CLINICAL DATA:  RIGHT upper quadrant abdominal pain and fever for 3 weeks. EXAM: CT ABDOMEN AND PELVIS WITH CONTRAST TECHNIQUE: Multidetector CT imaging of the abdomen and pelvis was performed using the standard protocol following bolus administration of intravenous contrast. CONTRAST:  186mL ISOVUE-300 IOPAMIDOL (ISOVUE-300) INJECTION 61% COMPARISON:  None. FINDINGS: Lower chest: No acute abnormality. Hepatobiliary: No focal liver abnormality is seen. No gallstones, gallbladder wall thickening, or biliary dilatation. Pancreas: Unremarkable. No pancreatic  ductal dilatation or surrounding inflammatory changes. Spleen: Normal in size without focal abnormality. Adrenals/Urinary Tract: Ill-defined edema/inflammation within the RIGHT perinephric space. Subtle heterogeneity of the RIGHT renal cortex suggests associated cortical edema. Additional mild inflammation/fluid stranding about the RIGHT ureter, suggesting ascending infection. Bladder is unremarkable, partially decompressed. LEFT kidney appears normal. No LEFT-sided perinephric edema. LEFT ureter appears normal. No ureteral or bladder calculi identified. Stomach/Bowel: No dilated large or small bowel loops. No bowel wall thickening or evidence of bowel wall inflammation seen. Appendix is normal. Stomach appears normal, partially decompressed. Vascular/Lymphatic: No significant vascular findings are present. No enlarged abdominal or pelvic lymph nodes. Reproductive: Prostate is unremarkable. Other: No abscess collection seen.  No free intraperitoneal air. Musculoskeletal: No acute or suspicious osseous finding. IMPRESSION: 1. Findings are highly suggestive of ascending infection of the RIGHT ureter and associated RIGHT-sided pyelonephritis. Findings could alternatively represent recently passed stone, considered less likely given patient's symptoms. Recommend correlation with urinalysis. 2. Remainder of the abdomen and pelvis CT  is unremarkable, as detailed above. Electronically Signed   By: Franki Cabot M.D.   On: 07/25/2018 18:34   US Abdomen Limited Ruq  Result Date: 07/25/2018 CLINICAL DATA:  57 year old male with acute RIGHT UPPER quadrant abdominal pain and fever for 3 weeks. EXAM: ULTRASOUND ABDOMEN LIMITED RIGHT UPPER QUADRANT COMPARISON:  None. FINDINGS: Gallbladder: The gallbladder is unremarkable. There is no evidence of cholelithiasis or acute cholecystitis. Common bile duct: Diameter: 2.7 mm. No evidence of intrahepatic or extrahepatic biliary dilatation. Liver: Diffusely increased echogenicity noted. No definite focal hepatic abnormality. Portal vein is patent on color Doppler imaging with normal direction of blood flow towards the liver. IMPRESSION: 1. Increased hepatic echogenicity compatible with hepatic steatosis. No other hepatic abnormalities. 2. No evidence of cholelithiasis, acute cholecystitis or biliary dilatation. Electronically Signed   By: Margarette Canada M.D.   On: 07/25/2018 16:59        Scheduled Meds: . ferrous sulfate  325 mg Oral BID WC   Continuous Infusions: . sodium chloride 100 mL/hr at 07/27/18 0128     LOS: 1 day    Time spent: 25 mins.More than 50% of that time was spent in counseling and/or coordination of care.      Shelly Coss, MD [facility] Pager [phone]  If 7PM-7AM, please contact night-coverage [email or web address] Password [email or web address] 07/27/2018, 1:45 PM

## 2018-07-27 NOTE — Consult Note (Signed)
Consult for [facility]  Reason for Consult: Anemia Referring Physician: [facility]  Woodfin Ganja HPI: This is a 57 year old male admitted with a right sided pyelonephritis.  At home he was experiencing right flank pain, fever, nausea, vomiting, and diarrhea.  The patient's symptoms started 2-3 weeks ago and it gradually worsened.  Work up in the ER was significant for an anemia of 9.7 g/dL, which declined to 7.5 g/dL with IV hydration.  He responded to antibiotic therapy and he feels better, but his HGB remains low.  An iron panel was performed and it was negative for an iron deficiency.  He denies any prior history of undergoing an EGD or colonoscopy.  The UA did show some blood, but this is expected with the pyelonephritis.  There is no family history of colon cancer and he does not use NSAIDs on a consistent basis.  He thinks he have had some more GERD issues of late.  Past Medical History:  Diagnosis Date  . Pyelonephritis 07/25/2018    Past Surgical History:  Procedure Laterality Date  . INGUINAL HERNIA REPAIR Left ~ 1972    Family History  Problem Relation Age of Onset  . Diabetes Mother     Social History:  reports that he has quit smoking. His smoking use included cigarettes. He has a 0.50 pack-year smoking history. He has never used smokeless tobacco. He reports that he does not drink alcohol or use drugs.  Allergies: No Known Allergies  Medications:  Scheduled: . ferrous sulfate  325 mg Oral BID WC  . potassium chloride  40 mEq Oral Once   Continuous: . sodium chloride 100 mL/hr at 07/27/18 0128    Results for orders placed or performed during the hospital encounter of 07/25/18 (from the past 24 hour(s))  Basic metabolic panel     Status: Abnormal   Collection Time: 07/26/18  2:41 PM  Result Value Ref Range   Sodium 133 (L) 135 - 145 mmol/L   Potassium 3.6 3.5 - 5.1 mmol/L   Chloride 103 98 - 111 mmol/L   CO2 22 22 - 32 mmol/L   Glucose, Bld 121 (H) 70 -  99 mg/dL   BUN 12 6 - 20 mg/dL   Creatinine, Ser 1.40 (H) 0.61 - 1.24 mg/dL   Calcium 7.9 (L) 8.9 - 10.3 mg/dL   GFR calc non Af Amer 54 (L) >60 mL/min   GFR calc Af Amer >60 >60 mL/min   Anion gap 8 5 - 15  Creatinine, urine, random     Status: None   Collection Time: 07/26/18  4:27 PM  Result Value Ref Range   Creatinine, Urine 128.54 mg/dL  Sodium, urine, random     Status: None   Collection Time: 07/26/18  4:27 PM  Result Value Ref Range   Sodium, Ur 57 mmol/L  Urea nitrogen, urine     Status: None   Collection Time: 07/26/18  4:27 PM  Result Value Ref Range   Urea Nitrogen, Ur 543 Not Estab. mg/dL  Occult blood card to lab, stool     Status: Abnormal   Collection Time: 07/26/18  6:12 PM  Result Value Ref Range   Fecal Occult Bld POSITIVE (A) NEGATIVE  Ferritin     Status: Abnormal   Collection Time: 07/27/18  6:56 AM  Result Value Ref Range   Ferritin 1,264 (H) 24 - 336 ng/mL  Iron and TIBC     Status: Abnormal   Collection Time: 07/27/18  6:56 AM  Result Value Ref Range   Iron 34 (L) 45 - 182 ug/dL   TIBC 143 (L) 250 - 450 ug/dL   Saturation Ratios 24 17.9 - 39.5 %   UIBC 109 ug/dL  CBC with Differential/Platelet     Status: Abnormal   Collection Time: 07/27/18  6:56 AM  Result Value Ref Range   WBC 4.3 4.0 - 10.5 K/uL   RBC 2.58 (L) 4.22 - 5.81 MIL/uL   Hemoglobin 7.5 (L) 13.0 - 17.0 g/dL   HCT 24.2 (L) 39.0 - 52.0 %   MCV 93.8 78.0 - 100.0 fL   MCH 29.1 26.0 - 34.0 pg   MCHC 31.0 30.0 - 36.0 g/dL   RDW 11.9 11.5 - 15.5 %   Platelets 274 150 - 400 K/uL   Neutrophils Relative % 60 %   Neutro Abs 2.6 1.7 - 7.7 K/uL   Lymphocytes Relative 16 %   Lymphs Abs 0.7 0.7 - 4.0 K/uL   Monocytes Relative 20 %   Monocytes Absolute 0.9 0.1 - 1.0 K/uL   Eosinophils Relative 2 %   Eosinophils Absolute 0.1 0.0 - 0.7 K/uL   Basophils Relative 1 %   Basophils Absolute 0.0 0.0 - 0.1 K/uL   Immature Granulocytes 1 %   Abs Immature Granulocytes 0.0 0.0 - 0.1 K/uL  Basic  metabolic panel     Status: Abnormal   Collection Time: 07/27/18  6:56 AM  Result Value Ref Range   Sodium 136 135 - 145 mmol/L   Potassium 3.3 (L) 3.5 - 5.1 mmol/L   Chloride 106 98 - 111 mmol/L   CO2 22 22 - 32 mmol/L   Glucose, Bld 106 (H) 70 - 99 mg/dL   BUN 10 6 - 20 mg/dL   Creatinine, Ser 1.19 0.61 - 1.24 mg/dL   Calcium 8.0 (L) 8.9 - 10.3 mg/dL   GFR calc non Af Amer >60 >60 mL/min   GFR calc Af Amer >60 >60 mL/min   Anion gap 8 5 - 15     Dg Chest 2 View  Result Date: 07/25/2018 CLINICAL DATA:  Fever x3 weeks. Former smoker with right upper chest and right arm pain. EXAM: CHEST - 2 VIEW COMPARISON:  01/18/2017 FINDINGS: Top-normal size heart. Slight uncoiling of the nonaneurysmal thoracic aorta. No acute pulmonary consolidation or CHF. No effusion or pneumothorax. No acute osseous abnormality. Degenerative changes are present the dorsal spine with slight depression of the T10 superior endplate since prior. IMPRESSION: No active cardiopulmonary disease. Slightly depressed appearance of the T10 superior endplate since previous exam may reflect a remote mild superior endplate compression fracture. Electronically Signed   By: Ashley Royalty M.D.   On: 07/25/2018 14:58   Dg Shoulder Right  Result Date: 07/25/2018 CLINICAL DATA:  RIGHT shoulder pain for 1 week. EXAM: RIGHT SHOULDER - 2+ VIEW COMPARISON:  None. FINDINGS: The humeral head is well-formed and located. The subacromial, glenohumeral and acromioclavicular joint spaces are intact. No destructive bony lesions. Soft tissue planes are non-suspicious. IMPRESSION: Negative. Electronically Signed   By: Elon Alas M.D.   On: 07/25/2018 17:19   Ct Abdomen Pelvis W Contrast  Result Date: 07/25/2018 CLINICAL DATA:  RIGHT upper quadrant abdominal pain and fever for 3 weeks. EXAM: CT ABDOMEN AND PELVIS WITH CONTRAST TECHNIQUE: Multidetector CT imaging of the abdomen and pelvis was performed using the standard protocol following bolus  administration of intravenous contrast. CONTRAST:  123mL ISOVUE-300 IOPAMIDOL (ISOVUE-300) INJECTION 61% COMPARISON:  None. FINDINGS:  Lower chest: No acute abnormality. Hepatobiliary: No focal liver abnormality is seen. No gallstones, gallbladder wall thickening, or biliary dilatation. Pancreas: Unremarkable. No pancreatic ductal dilatation or surrounding inflammatory changes. Spleen: Normal in size without focal abnormality. Adrenals/Urinary Tract: Ill-defined edema/inflammation within the RIGHT perinephric space. Subtle heterogeneity of the RIGHT renal cortex suggests associated cortical edema. Additional mild inflammation/fluid stranding about the RIGHT ureter, suggesting ascending infection. Bladder is unremarkable, partially decompressed. LEFT kidney appears normal. No LEFT-sided perinephric edema. LEFT ureter appears normal. No ureteral or bladder calculi identified. Stomach/Bowel: No dilated large or small bowel loops. No bowel wall thickening or evidence of bowel wall inflammation seen. Appendix is normal. Stomach appears normal, partially decompressed. Vascular/Lymphatic: No significant vascular findings are present. No enlarged abdominal or pelvic lymph nodes. Reproductive: Prostate is unremarkable. Other: No abscess collection seen.  No free intraperitoneal air. Musculoskeletal: No acute or suspicious osseous finding. IMPRESSION: 1. Findings are highly suggestive of ascending infection of the RIGHT ureter and associated RIGHT-sided pyelonephritis. Findings could alternatively represent recently passed stone, considered less likely given patient's symptoms. Recommend correlation with urinalysis. 2. Remainder of the abdomen and pelvis CT is unremarkable, as detailed above. Electronically Signed   By: Franki Cabot M.D.   On: 07/25/2018 18:34   US Abdomen Limited Ruq  Result Date: 07/25/2018 CLINICAL DATA:  57 year old male with acute RIGHT UPPER quadrant abdominal pain and fever for 3 weeks. EXAM:  ULTRASOUND ABDOMEN LIMITED RIGHT UPPER QUADRANT COMPARISON:  None. FINDINGS: Gallbladder: The gallbladder is unremarkable. There is no evidence of cholelithiasis or acute cholecystitis. Common bile duct: Diameter: 2.7 mm. No evidence of intrahepatic or extrahepatic biliary dilatation. Liver: Diffusely increased echogenicity noted. No definite focal hepatic abnormality. Portal vein is patent on color Doppler imaging with normal direction of blood flow towards the liver. IMPRESSION: 1. Increased hepatic echogenicity compatible with hepatic steatosis. No other hepatic abnormalities. 2. No evidence of cholelithiasis, acute cholecystitis or biliary dilatation. Electronically Signed   By: Margarette Canada M.D.   On: 07/25/2018 16:59    ROS:  As stated above in the HPI otherwise negative.  Blood pressure 108/70, pulse 83, temperature 99 F (37.2 C), temperature source Oral, resp. rate (!) 23, height 5\' 8"  (1.727 m), weight 111.1 kg, SpO2 100 %.    PE: Gen: NAD, Alert and Oriented HEENT:  [place]/AT, EOMI Neck: Supple, no LAD Lungs: CTA Bilaterally CV: RRR without M/G/R ABM: Soft, NTND, obese, +BS Ext: No C/C/E  Assessment/Plan: 1) Anemia. 2) Heme positive stool. 3) Pyelonephritis. 4) Fatty liver. 5) Mildly elevated abnormal liver enzymes.   The patient's anemia requires further evaluation with an EGD/Colonoscopy, even though there is no iron deficiency.  He is heme positive, but this may be a false negative as his initial testing was negative.  Regardless, he denies having a prior colonoscopy in the past.  As for his elevated liver enzymes, this is secondary to his fatty liver.  He is negative for HBV and HCV.  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  Plan: 1) EGD/Colonoscopy tomorrow.    Kiaja Shorty D 07/27/2018, 1:44 PM

## 2018-07-27 NOTE — H&P (View-Only) (Signed)
Consult for Nicholas Miller  Reason for Consult: Anemia Referring Physician: [facility]  Woodfin Ganja HPI: This is a 57 year old male admitted with a right sided pyelonephritis.  At home he was experiencing right flank pain, fever, nausea, vomiting, and diarrhea.  The patient's symptoms started 2-3 weeks ago and it gradually worsened.  Work up in the ER was significant for an anemia of 9.7 g/dL, which declined to 7.5 g/dL with IV hydration.  He responded to antibiotic therapy and he feels better, but his HGB remains low.  An iron panel was performed and it was negative for an iron deficiency.  He denies any prior history of undergoing an EGD or colonoscopy.  The UA did show some blood, but this is expected with the pyelonephritis.  There is no family history of colon cancer and he does not use NSAIDs on a consistent basis.  He thinks he have had some more GERD issues of late.  Past Medical History:  Diagnosis Date  . Pyelonephritis 07/25/2018    Past Surgical History:  Procedure Laterality Date  . INGUINAL HERNIA REPAIR Left ~ 1972    Family History  Problem Relation Age of Onset  . Diabetes Mother     Social History:  reports that he has quit smoking. His smoking use included cigarettes. He has a 0.50 pack-year smoking history. He has never used smokeless tobacco. He reports that he does not drink alcohol or use drugs.  Allergies: No Known Allergies  Medications:  Scheduled: . ferrous sulfate  325 mg Oral BID WC  . potassium chloride  40 mEq Oral Once   Continuous: . sodium chloride 100 mL/hr at 07/27/18 0128    Results for orders placed or performed during the hospital encounter of 07/25/18 (from the past 24 hour(s))  Basic metabolic panel     Status: Abnormal   Collection Time: 07/26/18  2:41 PM  Result Value Ref Range   Sodium 133 (L) 135 - 145 mmol/L   Potassium 3.6 3.5 - 5.1 mmol/L   Chloride 103 98 - 111 mmol/L   CO2 22 22 - 32 mmol/L   Glucose, Bld 121 (H) 70 -  99 mg/dL   BUN 12 6 - 20 mg/dL   Creatinine, Ser 1.40 (H) 0.61 - 1.24 mg/dL   Calcium 7.9 (L) 8.9 - 10.3 mg/dL   GFR calc non Af Amer 54 (L) >60 mL/min   GFR calc Af Amer >60 >60 mL/min   Anion gap 8 5 - 15  Creatinine, urine, random     Status: None   Collection Time: 07/26/18  4:27 PM  Result Value Ref Range   Creatinine, Urine 128.54 mg/dL  Sodium, urine, random     Status: None   Collection Time: 07/26/18  4:27 PM  Result Value Ref Range   Sodium, Ur 57 mmol/L  Urea nitrogen, urine     Status: None   Collection Time: 07/26/18  4:27 PM  Result Value Ref Range   Urea Nitrogen, Ur 543 Not Estab. mg/dL  Occult blood card to lab, stool     Status: Abnormal   Collection Time: 07/26/18  6:12 PM  Result Value Ref Range   Fecal Occult Bld POSITIVE (A) NEGATIVE  Ferritin     Status: Abnormal   Collection Time: 07/27/18  6:56 AM  Result Value Ref Range   Ferritin 1,264 (H) 24 - 336 ng/mL  Iron and TIBC     Status: Abnormal   Collection Time: 07/27/18  6:56 AM  Result Value Ref Range   Iron 34 (L) 45 - 182 ug/dL   TIBC 143 (L) 250 - 450 ug/dL   Saturation Ratios 24 17.9 - 39.5 %   UIBC 109 ug/dL  CBC with Differential/Platelet     Status: Abnormal   Collection Time: 07/27/18  6:56 AM  Result Value Ref Range   WBC 4.3 4.0 - 10.5 K/uL   RBC 2.58 (L) 4.22 - 5.81 MIL/uL   Hemoglobin 7.5 (L) 13.0 - 17.0 g/dL   HCT 24.2 (L) 39.0 - 52.0 %   MCV 93.8 78.0 - 100.0 fL   MCH 29.1 26.0 - 34.0 pg   MCHC 31.0 30.0 - 36.0 g/dL   RDW 11.9 11.5 - 15.5 %   Platelets 274 150 - 400 K/uL   Neutrophils Relative % 60 %   Neutro Abs 2.6 1.7 - 7.7 K/uL   Lymphocytes Relative 16 %   Lymphs Abs 0.7 0.7 - 4.0 K/uL   Monocytes Relative 20 %   Monocytes Absolute 0.9 0.1 - 1.0 K/uL   Eosinophils Relative 2 %   Eosinophils Absolute 0.1 0.0 - 0.7 K/uL   Basophils Relative 1 %   Basophils Absolute 0.0 0.0 - 0.1 K/uL   Immature Granulocytes 1 %   Abs Immature Granulocytes 0.0 0.0 - 0.1 K/uL  Basic  metabolic panel     Status: Abnormal   Collection Time: 07/27/18  6:56 AM  Result Value Ref Range   Sodium 136 135 - 145 mmol/L   Potassium 3.3 (L) 3.5 - 5.1 mmol/L   Chloride 106 98 - 111 mmol/L   CO2 22 22 - 32 mmol/L   Glucose, Bld 106 (H) 70 - 99 mg/dL   BUN 10 6 - 20 mg/dL   Creatinine, Ser 1.19 0.61 - 1.24 mg/dL   Calcium 8.0 (L) 8.9 - 10.3 mg/dL   GFR calc non Af Amer >60 >60 mL/min   GFR calc Af Amer >60 >60 mL/min   Anion gap 8 5 - 15     Dg Chest 2 View  Result Date: 07/25/2018 CLINICAL DATA:  Fever x3 weeks. Former smoker with right upper chest and right arm pain. EXAM: CHEST - 2 VIEW COMPARISON:  01/18/2017 FINDINGS: Top-normal size heart. Slight uncoiling of the nonaneurysmal thoracic aorta. No acute pulmonary consolidation or CHF. No effusion or pneumothorax. No acute osseous abnormality. Degenerative changes are present the dorsal spine with slight depression of the T10 superior endplate since prior. IMPRESSION: No active cardiopulmonary disease. Slightly depressed appearance of the T10 superior endplate since previous exam may reflect a remote mild superior endplate compression fracture. Electronically Signed   By: Ashley Royalty M.D.   On: 07/25/2018 14:58   Dg Shoulder Right  Result Date: 07/25/2018 CLINICAL DATA:  RIGHT shoulder pain for 1 week. EXAM: RIGHT SHOULDER - 2+ VIEW COMPARISON:  None. FINDINGS: The humeral head is well-formed and located. The subacromial, glenohumeral and acromioclavicular joint spaces are intact. No destructive bony lesions. Soft tissue planes are non-suspicious. IMPRESSION: Negative. Electronically Signed   By: Elon Alas M.D.   On: 07/25/2018 17:19   Ct Abdomen Pelvis W Contrast  Result Date: 07/25/2018 CLINICAL DATA:  RIGHT upper quadrant abdominal pain and fever for 3 weeks. EXAM: CT ABDOMEN AND PELVIS WITH CONTRAST TECHNIQUE: Multidetector CT imaging of the abdomen and pelvis was performed using the standard protocol following bolus  administration of intravenous contrast. CONTRAST:  165mL ISOVUE-300 IOPAMIDOL (ISOVUE-300) INJECTION 61% COMPARISON:  None. FINDINGS:  Lower chest: No acute abnormality. Hepatobiliary: No focal liver abnormality is seen. No gallstones, gallbladder wall thickening, or biliary dilatation. Pancreas: Unremarkable. No pancreatic ductal dilatation or surrounding inflammatory changes. Spleen: Normal in size without focal abnormality. Adrenals/Urinary Tract: Ill-defined edema/inflammation within the RIGHT perinephric space. Subtle heterogeneity of the RIGHT renal cortex suggests associated cortical edema. Additional mild inflammation/fluid stranding about the RIGHT ureter, suggesting ascending infection. Bladder is unremarkable, partially decompressed. LEFT kidney appears normal. No LEFT-sided perinephric edema. LEFT ureter appears normal. No ureteral or bladder calculi identified. Stomach/Bowel: No dilated large or small bowel loops. No bowel wall thickening or evidence of bowel wall inflammation seen. Appendix is normal. Stomach appears normal, partially decompressed. Vascular/Lymphatic: No significant vascular findings are present. No enlarged abdominal or pelvic lymph nodes. Reproductive: Prostate is unremarkable. Other: No abscess collection seen.  No free intraperitoneal air. Musculoskeletal: No acute or suspicious osseous finding. IMPRESSION: 1. Findings are highly suggestive of ascending infection of the RIGHT ureter and associated RIGHT-sided pyelonephritis. Findings could alternatively represent recently passed stone, considered less likely given patient's symptoms. Recommend correlation with urinalysis. 2. Remainder of the abdomen and pelvis CT is unremarkable, as detailed above. Electronically Signed   By: Franki Cabot M.D.   On: 07/25/2018 18:34   US Abdomen Limited Ruq  Result Date: 07/25/2018 CLINICAL DATA:  57 year old male with acute RIGHT UPPER quadrant abdominal pain and fever for 3 weeks. EXAM:  ULTRASOUND ABDOMEN LIMITED RIGHT UPPER QUADRANT COMPARISON:  None. FINDINGS: Gallbladder: The gallbladder is unremarkable. There is no evidence of cholelithiasis or acute cholecystitis. Common bile duct: Diameter: 2.7 mm. No evidence of intrahepatic or extrahepatic biliary dilatation. Liver: Diffusely increased echogenicity noted. No definite focal hepatic abnormality. Portal vein is patent on color Doppler imaging with normal direction of blood flow towards the liver. IMPRESSION: 1. Increased hepatic echogenicity compatible with hepatic steatosis. No other hepatic abnormalities. 2. No evidence of cholelithiasis, acute cholecystitis or biliary dilatation. Electronically Signed   By: Margarette Canada M.D.   On: 07/25/2018 16:59    ROS:  As stated above in the HPI otherwise negative.  Blood pressure 108/70, pulse 83, temperature 99 F (37.2 C), temperature source Oral, resp. rate (!) 23, height 5\' 8"  (1.727 m), weight 111.1 kg, SpO2 100 %.    PE: Gen: NAD, Alert and Oriented HEENT:  [place]/AT, EOMI Neck: Supple, no LAD Lungs: CTA Bilaterally CV: RRR without M/G/R ABM: Soft, NTND, obese, +BS Ext: No C/C/E  Assessment/Plan: 1) Anemia. 2) Heme positive stool. 3) Pyelonephritis. 4) Fatty liver. 5) Mildly elevated abnormal liver enzymes.   The patient's anemia requires further evaluation with an EGD/Colonoscopy, even though there is no iron deficiency.  He is heme positive, but this may be a false negative as his initial testing was negative.  Regardless, he denies having a prior colonoscopy in the past.  As for his elevated liver enzymes, this is secondary to his fatty liver.  He is negative for HBV and HCV.  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  Plan: 1) EGD/Colonoscopy tomorrow.    Meghin Thivierge D 07/27/2018, 1:44 PM

## 2018-07-28 ENCOUNTER — Encounter (HOSPITAL_COMMUNITY)
Admission: EM | Disposition: A | Discharge status: Home / Self Care | Payer: Self-pay | Source: Home / Self Care | Attending: Emergency Medicine

## 2018-07-28 ENCOUNTER — Encounter (HOSPITAL_COMMUNITY): Payer: Self-pay | Admitting: *Deleted

## 2018-07-28 DIAGNOSIS — N12 Tubulo-interstitial nephritis, not specified as acute or chronic: Secondary | ICD-10-CM | POA: Diagnosis not present

## 2018-07-28 HISTORY — PX: POLYPECTOMY: SHX5525

## 2018-07-28 HISTORY — PX: BIOPSY: SHX5522

## 2018-07-28 HISTORY — PX: ESOPHAGOGASTRODUODENOSCOPY: SHX5428

## 2018-07-28 HISTORY — PX: COLONOSCOPY: SHX5424

## 2018-07-28 LAB — CBC WITH DIFFERENTIAL/PLATELET
Abs Immature Granulocytes: 0 10*3/uL (ref 0.0–0.1)
Basophils Absolute: 0 10*3/uL (ref 0.0–0.1)
Basophils Relative: 1 %
EOS ABS: 0.1 10*3/uL (ref 0.0–0.7)
Eosinophils Relative: 3 %
HEMATOCRIT: 25.3 % — AB (ref 39.0–52.0)
Hemoglobin: 7.9 g/dL — ABNORMAL LOW (ref 13.0–17.0)
IMMATURE GRANULOCYTES: 1 %
LYMPHS ABS: 0.9 10*3/uL (ref 0.7–4.0)
Lymphocytes Relative: 23 %
MCH: 29.4 pg (ref 26.0–34.0)
MCHC: 31.2 g/dL (ref 30.0–36.0)
MCV: 94.1 fL (ref 78.0–100.0)
MONO ABS: 0.6 10*3/uL (ref 0.1–1.0)
MONOS PCT: 15 %
NEUTROS PCT: 57 %
Neutro Abs: 2.3 10*3/uL (ref 1.7–7.7)
Platelets: 301 10*3/uL (ref 150–400)
RBC: 2.69 MIL/uL — ABNORMAL LOW (ref 4.22–5.81)
RDW: 11.9 % (ref 11.5–15.5)
WBC: 4 10*3/uL (ref 4.0–10.5)

## 2018-07-28 SURGERY — EGD (ESOPHAGOGASTRODUODENOSCOPY)
Anesthesia: Moderate Sedation

## 2018-07-28 MED ORDER — PANTOPRAZOLE SODIUM 40 MG PO TBEC
40.0000 mg | DELAYED_RELEASE_TABLET | Freq: Every day | ORAL | Status: DC
  Administered 2018-07-28: 40 mg via ORAL
  Filled 2018-07-28: qty 1

## 2018-07-28 MED ORDER — FENTANYL CITRATE (PF) 100 MCG/2ML IJ SOLN
INTRAMUSCULAR | Status: DC | PRN
  Administered 2018-07-28 (×2): 25 ug via INTRAVENOUS

## 2018-07-28 MED ORDER — DIPHENHYDRAMINE HCL 50 MG/ML IJ SOLN
INTRAMUSCULAR | Status: AC
  Filled 2018-07-28: qty 1

## 2018-07-28 MED ORDER — MIDAZOLAM HCL 5 MG/5ML IJ SOLN
INTRAMUSCULAR | Status: DC | PRN
  Administered 2018-07-28: 2 mg via INTRAVENOUS

## 2018-07-28 MED ORDER — PANTOPRAZOLE SODIUM 40 MG PO TBEC: 40.0000 mg | DELAYED_RELEASE_TABLET | Freq: Every day | ORAL | 0 refills | Status: DC

## 2018-07-28 MED ORDER — MIDAZOLAM HCL 5 MG/ML IJ SOLN
INTRAMUSCULAR | Status: AC
  Filled 2018-07-28: qty 2

## 2018-07-28 MED ORDER — FERROUS SULFATE 325 (65 FE) MG PO TABS: 325.0000 mg | ORAL_TABLET | Freq: Two times a day (BID) | ORAL | 1 refills | Status: DC

## 2018-07-28 MED ORDER — BUTAMBEN-TETRACAINE-BENZOCAINE 2-2-14 % EX AERO
INHALATION_SPRAY | CUTANEOUS | Status: DC | PRN
  Administered 2018-07-28: 1 via TOPICAL

## 2018-07-28 MED ORDER — FENTANYL CITRATE (PF) 100 MCG/2ML IJ SOLN
INTRAMUSCULAR | Status: AC
  Filled 2018-07-28: qty 2

## 2018-07-28 MED ORDER — PEG 3350-KCL-NA BICARB-NACL 420 G PO SOLR
4000.0000 mL | Freq: Once | ORAL | Status: AC
  Administered 2018-07-28: 4000 mL via ORAL
  Filled 2018-07-28 (×2): qty 4000

## 2018-07-28 MED ORDER — SODIUM CHLORIDE 0.9 % IV SOLN: INTRAVENOUS | Status: DC

## 2018-07-28 MED ORDER — MIDAZOLAM HCL 10 MG/2ML IJ SOLN
INTRAMUSCULAR | Status: DC | PRN
  Administered 2018-07-28: 1 mg via INTRAVENOUS
  Administered 2018-07-28 (×2): 2 mg via INTRAVENOUS

## 2018-07-28 NOTE — Op Note (Signed)
[facility] Patient Name: Nicholas Miller Procedure Date : 07/28/2018 MRN: [id number] Attending MD: Carol Ada , MD Date of Birth: 1961-05-11 CSN: [id number] Age: 57 Admit Type: Inpatient Procedure:                Colonoscopy Indications:              Heme positive stool Providers:                Carol Ada, MD, Cleda Daub, RN, Nevin Bloodgood, Technician Referring MD:              Medicines:                Fentanyl 50 micrograms IV, Midazolam 5 mg IV Complications:            No immediate complications. Estimated Blood Loss:     Estimated blood loss was minimal. Procedure:                Pre-Anesthesia Assessment:                           - Prior to the procedure, a History and Physical                            was performed, and patient medications and                            allergies were reviewed. The patient's tolerance of                            previous anesthesia was also reviewed. The risks                            and benefits of the procedure and the sedation                            options and risks were discussed with the patient.                            All questions were answered, and informed consent                            was obtained. Prior Anticoagulants: The patient has                            taken no previous anticoagulant or antiplatelet                            agents. ASA Grade Assessment: II - A patient with                            mild systemic disease. After reviewing the risks  and benefits, the patient was deemed in                            satisfactory condition to undergo the procedure.                           - Sedation was administered by an endoscopy nurse.                            The sedation level attained was moderate.                           After obtaining informed consent, the colonoscope                            was passed under direct  vision. Throughout the                            procedure, the patient's blood pressure, pulse, and                            oxygen saturations were monitored continuously. The                            PCF-H190DL ([id number]) peds colon was introduced                            through the anus and advanced to the the cecum,                            identified by appendiceal orifice and ileocecal                            valve. The colonoscopy was performed without                            difficulty. The patient tolerated the procedure                            well. The quality of the bowel preparation was                            excellent. The ileocecal valve, appendiceal                            orifice, and rectum were photographed. Scope In: 8:26:18 AM Scope Out: 8:38:44 AM Scope Withdrawal Time: 0 hours 10 minutes 54 seconds  Total Procedure Duration: 0 hours 12 minutes 26 seconds  Findings:      Two sessile polyps were found in the descending colon and ascending       colon. The polyps were 2 to 3 mm in size. These polyps were removed with       a cold snare. Resection and retrieval were complete. Impression:               -  Two 2 to 3 mm polyps in the descending colon and                            in the ascending colon, removed with a cold snare.                            Resected and retrieved. Recommendation:           - Return patient to hospital ward for ongoing care.                           - Resume regular diet.                           - Continue present medications.                           - Await pathology results.                           - Repeat colonoscopy in 5-10 years for surveillance. Procedure Code(s):        --- Professional ---                           [phone], Colonoscopy, flexible; with removal of                            tumor(s), polyp(s), or other lesion(s) by snare                            technique Diagnosis Code(s):         --- Professional ---                           D12.4, Benign neoplasm of descending colon                           D12.2, Benign neoplasm of ascending colon                           R19.5, Other fecal abnormalities CPT copyright 2017 [employer]. All rights reserved. The codes documented in this report are preliminary and upon coder review may  be revised to meet current compliance requirements. Carol Ada, MD Carol Ada, MD 07/28/2018 8:47:12 AM This report has been signed electronically. Number of Addenda: 0

## 2018-07-28 NOTE — Progress Notes (Signed)
Patient discharged to home. Patient AVS reviewed and signed. Patient capable re-verbalizing medications and follow-up appointments. IV removed. Patient belongings sent with patient. Patient educated to return to the ED in the event of SOB, chest pain or dizziness.   Rease Wence B. RN 

## 2018-07-28 NOTE — Progress Notes (Addendum)
Wife came in and asked this nurse about pt taking bowel prep. This RN stated there was no orders for bowel prep. Order was not released. This RN called MD Benson Norway and was told to give bowel prep NOW and to make sure pt has it down before 0600. This RN explained this to the pt and poured pt the first cup. Will continue to make sure pt is drinking prep.   Eleanora Neighbor, RN

## 2018-07-28 NOTE — Interval H&P Note (Signed)
History and Physical Interval Note:  07/28/2018 8:08 AM  Nicholas Miller  has presented today for surgery, with the diagnosis of Anemia and Heme positive stool  The various methods of treatment have been discussed with the patient and family. After consideration of risks, benefits and other options for treatment, the patient has consented to  Procedure(s): ESOPHAGOGASTRODUODENOSCOPY (EGD) (N/A) COLONOSCOPY (N/A) as a surgical intervention .  The patient's history has been reviewed, patient examined, no change in status, stable for surgery.  I have reviewed the patient's chart and labs.  Questions were answered to the patient's satisfaction.     [name] D

## 2018-07-28 NOTE — Op Note (Signed)
[facility] Patient Name: Nicholas Miller Procedure Date : 07/28/2018 MRN: [id number] Attending MD: Carol Ada , MD Date of Birth: Apr 05, 1961 CSN: [id number] Age: 57 Admit Type: Inpatient Procedure:                Upper GI endoscopy Indications:              Iron deficiency anemia secondary to chronic blood                            loss Providers:                Carol Ada, MD, Cleda Daub, RN, Nevin Bloodgood, Technician Referring MD:              Medicines:                See the colonoscopy report. Complications:            No immediate complications. Estimated Blood Loss:     Estimated blood loss: none. Estimated blood loss                            was minimal. Procedure:                Pre-Anesthesia Assessment:                           - Prior to the procedure, a History and Physical                            was performed, and patient medications and                            allergies were reviewed. The patient's tolerance of                            previous anesthesia was also reviewed. The risks                            and benefits of the procedure and the sedation                            options and risks were discussed with the patient.                            All questions were answered, and informed consent                            was obtained. Prior Anticoagulants: The patient has                            taken no previous anticoagulant or antiplatelet                            agents. ASA Grade Assessment:  II - A patient with                            mild systemic disease. After reviewing the risks                            and benefits, the patient was deemed in                            satisfactory condition to undergo the procedure.                           - Sedation was administered by an endoscopy nurse.                            The sedation level attained was moderate.         After obtaining informed consent, the endoscope was                            passed under direct vision. Throughout the                            procedure, the patient's blood pressure, pulse, and                            oxygen saturations were monitored continuously. The                            PCF-H190DL ([id number]) peds colon was introduced                            through the mouth, and advanced to the second part                            of duodenum. The upper GI endoscopy was                            accomplished without difficulty. The patient                            tolerated the procedure well. Scope In: Scope Out: Findings:      The esophagus was normal.      Localized mild inflammation characterized by erythema was found in the       gastric body. Biopsies were taken with a cold forceps for histology.      Patchy mildly erythematous mucosa without active bleeding and with no       stigmata of bleeding was found in the duodenal bulb. Impression:               - Normal esophagus.                           - Gastritis. Biopsied.                           -  Erythematous duodenopathy. Recommendation:           - Return patient to hospital ward for ongoing care.                           - Resume regular diet.                           - Continue present medications.                           - Await pathology results.                           - Signing off of care. Procedure Code(s):        --- Professional ---                           [phone], Esophagogastroduodenoscopy, flexible,                            transoral; with biopsy, single or multiple Diagnosis Code(s):        --- Professional ---                           K29.70, Gastritis, unspecified, without bleeding                           K31.89, Other diseases of stomach and duodenum                           D50.0, Iron deficiency anemia secondary to blood                            loss  (chronic) CPT copyright 2017 American Medical Association. All rights reserved. The codes documented in this report are preliminary and upon coder review may  be revised to meet current compliance requirements. Carol Ada, MD Carol Ada, MD 07/28/2018 8:50:05 AM This report has been signed electronically. Number of Addenda: 0

## 2018-07-28 NOTE — Progress Notes (Signed)
Pt finished ALL of bowel prep at this time.  Eleanora Neighbor, RN

## 2018-07-28 NOTE — Discharge Summary (Signed)
Physician Discharge Summary  Nicholas Miller JHE:[id number] DOB: June 23, 1961 DOA: 07/25/2018  PCP: Patient, No Pcp Per  Admit date: 07/25/2018 Discharge date: 07/28/2018  Admitted From: Home Disposition:  Home  Discharge Condition:Stable CODE STATUS:FULL Diet recommendation:  Regular   Brief/Interim Summary: Patient is a 57 year old male with no significant past medical history presented to the emergency department for the evaluation of fever, right flank pain, nausea, vomiting and diarrhea.  CT imaging done in the emergency department was suggestive of right-sided pyelonephritis versus recently passed stone.  Patient was admitted for the management of right-sided pyelonephritis with IV antibiotics.  Patient's urine culture did not show any growth so antibiotics discontinued.  CT scan finding was most likely secondary to recently passed stone.  He was also found to have severe normocytic anemia, which is a new finding.  Patient has not followed with the PCP for last many years. FOBT came out to be positive.  GI consulted and he underwent  EGD / coloscopy which showed some gastritis and 2 sessile polyps.  He is stable to be discharged home today.  Following problems were addressed during his hospitalization:  Right-sided pyelonephritis: Questionable diagnosis.  UA not suggestive of frank UTI.  Urine culture did not show any growth.  To biotics discontinued.  CT finding could be most likely associated with with recently passed stone.  Patient is afebrile, hemodynamically stable now.  He does not complain of any lower urinary tract symptoms.  Renal insufficiency: Resolved with IV fluids.  Diarrhea: Resolved.  GI pathogen blood negative  Normocytic anemia: Hemoglobin in the range of 7.    Iron studies suggest low iron.FOBT positive.GI consulted for EGD/colonoscopy.  EGD showed gastritis, 2 sessile polyps which were removed and biopsied.  Patient started on Protonix.  Also started on iron  supplementation  Hyponatremia:Resolved.  Elevated LFTs: Most likely associated with hepatic steatosis.  Ultrasound of the liver suggestive of hepatic stenosis.  Hepatitis panel negative.   Discharge Diagnoses:  Principal Problem:   Pyelonephritis Active Problems:   Hyponatremia   Renal insufficiency   Hyperbilirubinemia   Normocytic anemia   Diarrhea with dehydration    Discharge Instructions  Discharge Instructions    Diet general   Complete by:  As directed    Discharge instructions   Complete by:  As directed    1) Please follow up with your PCP in a week.  Do a CBC test to check your hemoglobin in 4 weeks. 2)Take prescribed medications as instructed.   Increase activity slowly   Complete by:  As directed      Allergies as of 07/28/2018   No Known Allergies     Medication List    STOP taking these medications   benzonatate 100 MG capsule Commonly known as:  TESSALON   dextromethorphan-guaiFENesin 30-600 MG 12hr tablet Commonly known as:  MUCINEX DM   fluticasone 50 MCG/ACT nasal spray Commonly known as:  FLONASE   ibuprofen 200 MG tablet Commonly known as:  ADVIL,MOTRIN     TAKE these medications   ferrous sulfate 325 (65 FE) MG tablet Take 1 tablet (325 mg total) by mouth 2 (two) times daily with a meal.   pantoprazole 40 MG tablet Commonly known as:  PROTONIX Take 1 tablet (40 mg total) by mouth daily.       No Known Allergies  Consultations: GI  Procedures/Studies: Dg Chest 2 View  Result Date: 07/25/2018 CLINICAL DATA:  Fever x3 weeks. Former smoker with right upper chest and right arm  pain. EXAM: CHEST - 2 VIEW COMPARISON:  01/18/2017 FINDINGS: Top-normal size heart. Slight uncoiling of the nonaneurysmal thoracic aorta. No acute pulmonary consolidation or CHF. No effusion or pneumothorax. No acute osseous abnormality. Degenerative changes are present the dorsal spine with slight depression of the T10 superior endplate since prior.  IMPRESSION: No active cardiopulmonary disease. Slightly depressed appearance of the T10 superior endplate since previous exam may reflect a remote mild superior endplate compression fracture. Electronically Signed   By: Ashley Royalty M.D.   On: 07/25/2018 14:58   Dg Shoulder Right  Result Date: 07/25/2018 CLINICAL DATA:  RIGHT shoulder pain for 1 week. EXAM: RIGHT SHOULDER - 2+ VIEW COMPARISON:  None. FINDINGS: The humeral head is well-formed and located. The subacromial, glenohumeral and acromioclavicular joint spaces are intact. No destructive bony lesions. Soft tissue planes are non-suspicious. IMPRESSION: Negative. Electronically Signed   By: Elon Alas M.D.   On: 07/25/2018 17:19   Ct Abdomen Pelvis W Contrast  Result Date: 07/25/2018 CLINICAL DATA:  RIGHT upper quadrant abdominal pain and fever for 3 weeks. EXAM: CT ABDOMEN AND PELVIS WITH CONTRAST TECHNIQUE: Multidetector CT imaging of the abdomen and pelvis was performed using the standard protocol following bolus administration of intravenous contrast. CONTRAST:  124mL ISOVUE-300 IOPAMIDOL (ISOVUE-300) INJECTION 61% COMPARISON:  None. FINDINGS: Lower chest: No acute abnormality. Hepatobiliary: No focal liver abnormality is seen. No gallstones, gallbladder wall thickening, or biliary dilatation. Pancreas: Unremarkable. No pancreatic ductal dilatation or surrounding inflammatory changes. Spleen: Normal in size without focal abnormality. Adrenals/Urinary Tract: Ill-defined edema/inflammation within the RIGHT perinephric space. Subtle heterogeneity of the RIGHT renal cortex suggests associated cortical edema. Additional mild inflammation/fluid stranding about the RIGHT ureter, suggesting ascending infection. Bladder is unremarkable, partially decompressed. LEFT kidney appears normal. No LEFT-sided perinephric edema. LEFT ureter appears normal. No ureteral or bladder calculi identified. Stomach/Bowel: No dilated large or small bowel loops. No bowel  wall thickening or evidence of bowel wall inflammation seen. Appendix is normal. Stomach appears normal, partially decompressed. Vascular/Lymphatic: No significant vascular findings are present. No enlarged abdominal or pelvic lymph nodes. Reproductive: Prostate is unremarkable. Other: No abscess collection seen.  No free intraperitoneal air. Musculoskeletal: No acute or suspicious osseous finding. IMPRESSION: 1. Findings are highly suggestive of ascending infection of the RIGHT ureter and associated RIGHT-sided pyelonephritis. Findings could alternatively represent recently passed stone, considered less likely given patient's symptoms. Recommend correlation with urinalysis. 2. Remainder of the abdomen and pelvis CT is unremarkable, as detailed above. Electronically Signed   By: Franki Cabot M.D.   On: 07/25/2018 18:34   US Abdomen Limited Ruq  Result Date: 07/25/2018 CLINICAL DATA:  57 year old male with acute RIGHT UPPER quadrant abdominal pain and fever for 3 weeks. EXAM: ULTRASOUND ABDOMEN LIMITED RIGHT UPPER QUADRANT COMPARISON:  None. FINDINGS: Gallbladder: The gallbladder is unremarkable. There is no evidence of cholelithiasis or acute cholecystitis. Common bile duct: Diameter: 2.7 mm. No evidence of intrahepatic or extrahepatic biliary dilatation. Liver: Diffusely increased echogenicity noted. No definite focal hepatic abnormality. Portal vein is patent on color Doppler imaging with normal direction of blood flow towards the liver. IMPRESSION: 1. Increased hepatic echogenicity compatible with hepatic steatosis. No other hepatic abnormalities. 2. No evidence of cholelithiasis, acute cholecystitis or biliary dilatation. Electronically Signed   By: Margarette Canada M.D.   On: 07/25/2018 16:59       Subjective: Patient seen and examined the bedside this morning.  Remains comfortable.  Hemodynamically stable.  No active issues.  Stable for discharge home today.  Discharge Exam: Vitals:   07/28/18 0850  07/28/18 0915  BP: 124/84 (!) 135/92  Pulse: 79 84  Resp: 18 18  Temp:  98.1 F (36.7 C)  SpO2: 100% 100%   Vitals:   07/28/18 0842 07/28/18 0845 07/28/18 0850 07/28/18 0915  BP: (!) 133/91 122/82 124/84 (!) 135/92  Pulse: 82 81 79 84  Resp: (!) 29 (!) 23 18 18   Temp:  98 F (36.7 C)  98.1 F (36.7 C)  TempSrc:  Oral  Oral  SpO2: 100% 100% 100% 100%  Weight:      Height:        General: Pt is alert, awake, not in acute distress Cardiovascular: RRR, S1/S2 +, no rubs, no gallops Respiratory: CTA bilaterally, no wheezing, no rhonchi Abdominal: Soft, NT, ND, bowel sounds + Extremities: no edema, no cyanosis    The results of significant diagnostics from this hospitalization (including imaging, microbiology, ancillary and laboratory) are listed below for reference.     Microbiology: Recent Results (from the past 240 hour(s))  Blood culture (routine x 2)     Status: None (Preliminary result)   Collection Time: 07/25/18  2:23 PM  Result Value Ref Range Status   Specimen Description   Final    BLOOD RIGHT ANTECUBITAL Performed at [facility], [address] [place], [place] [address]    Special Requests   Final    BOTTLES DRAWN AEROBIC AND ANAEROBIC Blood Culture adequate volume Performed at [facility], [address] [place], [place] [address]    Culture   Final    NO GROWTH 3 DAYS Performed at [facility], [place] [address], [place], [place] [address]    Report Status PENDING  Incomplete  Urine culture     Status: None   Collection Time: 07/25/18  2:23 PM  Result Value Ref Range Status   Specimen Description   Final    URINE, CLEAN CATCH Performed at [facility], [address] [place], [place] [address]    Special Requests   Final    NONE Performed at [facility], [address] [place], [place] [address]    Culture   Final    NO GROWTH Performed at [facility], [place] [address], [place],  [place] [address]    Report Status 07/26/2018 FINAL  Final  Blood culture (routine x 2)     Status: None (Preliminary result)   Collection Time: 07/25/18  2:28 PM  Result Value Ref Range Status   Specimen Description   Final    BLOOD LEFT ANTECUBITAL Performed at [facility], [address] [place], [place] [address]    Special Requests   Final    BOTTLES DRAWN AEROBIC AND ANAEROBIC Blood Culture adequate volume Performed at [facility], [address] [place], [place] [address]    Culture   Final    NO GROWTH 3 DAYS Performed at [facility], [place] [address], [place], [place] [address]    Report Status PENDING  Incomplete  Gastrointestinal Panel by PCR , Stool     Status: None   Collection Time: 07/25/18  3:25 PM  Result Value Ref Range Status   Campylobacter species NOT DETECTED NOT DETECTED Final   Plesimonas shigelloides NOT DETECTED NOT DETECTED Final   Salmonella species NOT DETECTED NOT DETECTED Final   Yersinia enterocolitica NOT DETECTED NOT DETECTED Final  Vibrio species NOT DETECTED NOT DETECTED Final   Vibrio cholerae NOT DETECTED NOT DETECTED Final   Enteroaggregative E coli (EAEC) NOT DETECTED NOT DETECTED Final   Enteropathogenic E coli (EPEC) NOT DETECTED NOT DETECTED Final   Enterotoxigenic E coli (ETEC) NOT DETECTED NOT DETECTED Final   Shiga like toxin producing E coli (STEC) NOT DETECTED NOT DETECTED Final   Shigella/Enteroinvasive E coli (EIEC) NOT DETECTED NOT DETECTED Final   Cryptosporidium NOT DETECTED NOT DETECTED Final   Cyclospora cayetanensis NOT DETECTED NOT DETECTED Final   Entamoeba histolytica NOT DETECTED NOT DETECTED Final   Giardia lamblia NOT DETECTED NOT DETECTED Final   Adenovirus F40/41 NOT DETECTED NOT DETECTED Final   Astrovirus NOT DETECTED NOT DETECTED Final   Norovirus GI/GII NOT DETECTED NOT DETECTED Final   Rotavirus A NOT DETECTED NOT DETECTED Final   Sapovirus (I, II, IV, and V) NOT DETECTED NOT DETECTED  Final    Comment: Performed at [facility], [address] [place], [place] [address]     Labs: BNP (last 3 results) No results for input(s): BNP in the last 8760 hours. Basic Metabolic Panel: Recent Labs  Lab 07/25/18 1422 07/26/18 0626 07/26/18 1441 07/27/18 0656  NA 130* 135 133* 136  K 3.5 3.5 3.6 3.3*  CL 96* 104 103 106  CO2 20* 23 22 22   GLUCOSE 107* 102* 121* 106*  BUN 19 13 12 10   CREATININE 1.93* 1.48* 1.40* 1.19  CALCIUM 7.8* 7.8* 7.9* 8.0*   Liver Function Tests: Recent Labs  Lab 07/25/18 1422 07/26/18 0626  AST 60* 52*  ALT 54* 52*  ALKPHOS 59 48  BILITOT 2.5* 1.7*  PROT 8.0 6.3*  ALBUMIN 3.1* 2.4*   Recent Labs  Lab 07/25/18 1422  LIPASE 68*   No results for input(s): AMMONIA in the last 168 hours. CBC: Recent Labs  Lab 07/25/18 1422 07/26/18 0626 07/27/18 0656 07/28/18 0705  WBC 8.6 6.1 4.3 4.0  NEUTROABS 6.5 4.3 2.6 2.3  HGB 9.7* 7.9* 7.5* 7.9*  HCT 29.6* 25.7* 24.2* 25.3*  MCV 90.8 94.1 93.8 94.1  PLT 286 246 274 301   Cardiac Enzymes: Recent Labs  Lab 07/25/18 1422  TROPONINI <0.03   BNP: Invalid input(s): POCBNP CBG: No results for input(s): GLUCAP in the last 168 hours. D-Dimer No results for input(s): DDIMER in the last 72 hours. Hgb A1c No results for input(s): HGBA1C in the last 72 hours. Lipid Profile No results for input(s): CHOL, HDL, LDLCALC, TRIG, CHOLHDL, LDLDIRECT in the last 72 hours. Thyroid function studies No results for input(s): TSH, T4TOTAL, T3FREE, THYROIDAB in the last 72 hours.  Invalid input(s): FREET3 Anemia work up Recent Labs    07/27/18 0656  FERRITIN 1,264*  TIBC 143*  IRON 34*   Urinalysis    Component Value Date/Time   COLORURINE ORANGE (A) 07/25/2018 [place] 07/25/2018 1423   LABSPEC 1.020 07/25/2018 1423   PHURINE 6.0 07/25/2018 1423   GLUCOSEU NEGATIVE 07/25/2018 1423   HGBUR MODERATE (A) 07/25/2018 1423   BILIRUBINUR MODERATE (A) 07/25/2018 1423    KETONESUR 40 (A) 07/25/2018 1423   PROTEINUR 100 (A) 07/25/2018 1423   NITRITE NEGATIVE 07/25/2018 1423   LEUKOCYTESUR NEGATIVE 07/25/2018 1423   Sepsis Labs Invalid input(s): PROCALCITONIN,  WBC,  LACTICIDVEN Microbiology Recent Results (from the past 240 hour(s))  Blood culture (routine x 2)     Status: None (Preliminary result)   Collection Time: 07/25/18  2:23 PM  Result Value Ref  Range Status   Specimen Description   Final    BLOOD RIGHT ANTECUBITAL Performed at [facility], [address] [place], [place] [address]    Special Requests   Final    BOTTLES DRAWN AEROBIC AND ANAEROBIC Blood Culture adequate volume Performed at [facility], [address] [place], [place] [address]    Culture   Final    NO GROWTH 3 DAYS Performed at [facility], [place] [address] [place], [place] [address]    Report Status PENDING  Incomplete  Urine culture     Status: None   Collection Time: 07/25/18  2:23 PM  Result Value Ref Range Status   Specimen Description   Final    URINE, CLEAN CATCH Performed at [facility], [address] [place], [place] [address]    Special Requests   Final    NONE Performed at [facility], [address] [place], [place] [address]    Culture   Final    NO GROWTH Performed at [facility], [place] [address], [place], [place] [address]    Report Status 07/26/2018 FINAL  Final  Blood culture (routine x 2)     Status: None (Preliminary result)   Collection Time: 07/25/18  2:28 PM  Result Value Ref Range Status   Specimen Description   Final    BLOOD LEFT ANTECUBITAL Performed at [facility], [address] [place], [place] [address]    Special Requests   Final    BOTTLES DRAWN AEROBIC AND ANAEROBIC Blood Culture adequate volume Performed at [facility], [address] [place], [place] [address]    Culture   Final    NO GROWTH 3 DAYS Performed at [place], [place] [address] [place], [place] [address]    Report Status PENDING  Incomplete  Gastrointestinal Panel by PCR , Stool     Status: None   Collection Time: 07/25/18  3:25 PM  Result Value Ref Range Status   Campylobacter species NOT DETECTED NOT DETECTED Final   Plesimonas shigelloides NOT DETECTED NOT DETECTED Final   Salmonella species NOT DETECTED NOT DETECTED Final   Yersinia enterocolitica NOT DETECTED NOT DETECTED Final   Vibrio species NOT DETECTED NOT DETECTED Final   Vibrio cholerae NOT DETECTED NOT DETECTED Final   Enteroaggregative E coli (EAEC) NOT DETECTED NOT DETECTED Final   Enteropathogenic E coli (EPEC) NOT DETECTED NOT DETECTED Final   Enterotoxigenic E coli (ETEC) NOT DETECTED NOT DETECTED Final   Shiga like toxin producing E coli (STEC) NOT DETECTED NOT DETECTED Final   Shigella/Enteroinvasive E coli (EIEC) NOT DETECTED NOT DETECTED Final   Cryptosporidium NOT DETECTED NOT DETECTED Final   Cyclospora cayetanensis NOT DETECTED NOT DETECTED Final   Entamoeba histolytica NOT DETECTED NOT DETECTED Final   Giardia lamblia NOT DETECTED NOT DETECTED Final   Adenovirus F40/41 NOT DETECTED NOT DETECTED Final   Astrovirus NOT DETECTED NOT DETECTED Final   Norovirus GI/GII NOT DETECTED NOT DETECTED Final   Rotavirus A NOT DETECTED NOT DETECTED Final   Sapovirus (I, II, IV, and V) NOT DETECTED NOT DETECTED Final    Comment: Performed at [facility], [address], [place], [place] [address]    Please note: You were cared for by a hospitalist during your hospital stay. Once you are discharged, your primary care physician will handle any further medical  issues. Please note that NO REFILLS for any discharge medications will be authorized once you are discharged, as it is imperative that you return to your primary care physician (or establish a relationship with a primary care physician if you do not have one) for your post hospital discharge needs so that they can  reassess your need for medications and monitor your lab values.    Time coordinating discharge: 40 minutes  SIGNED:   Shelly Coss, MD  [facility] 07/28/2018, 11:17 AM Pager [phone]  If 7PM-7AM, please contact night-coverage [email or web address] Password [email or web address]

## 2018-07-30 LAB — CULTURE, BLOOD (ROUTINE X 2)
CULTURE: NO GROWTH
CULTURE: NO GROWTH
SPECIAL REQUESTS: ADEQUATE
Special Requests: ADEQUATE

## 2019-08-19 IMAGING — CR DG SHOULDER 2+V*R*
1 series · 1 of 1 positions shown · non-contrast
Comparison: None.

CLINICAL DATA: RIGHT shoulder pain for 1 week.

EXAM:
RIGHT SHOULDER - 2+ VIEW

[w shoulder grashey right]
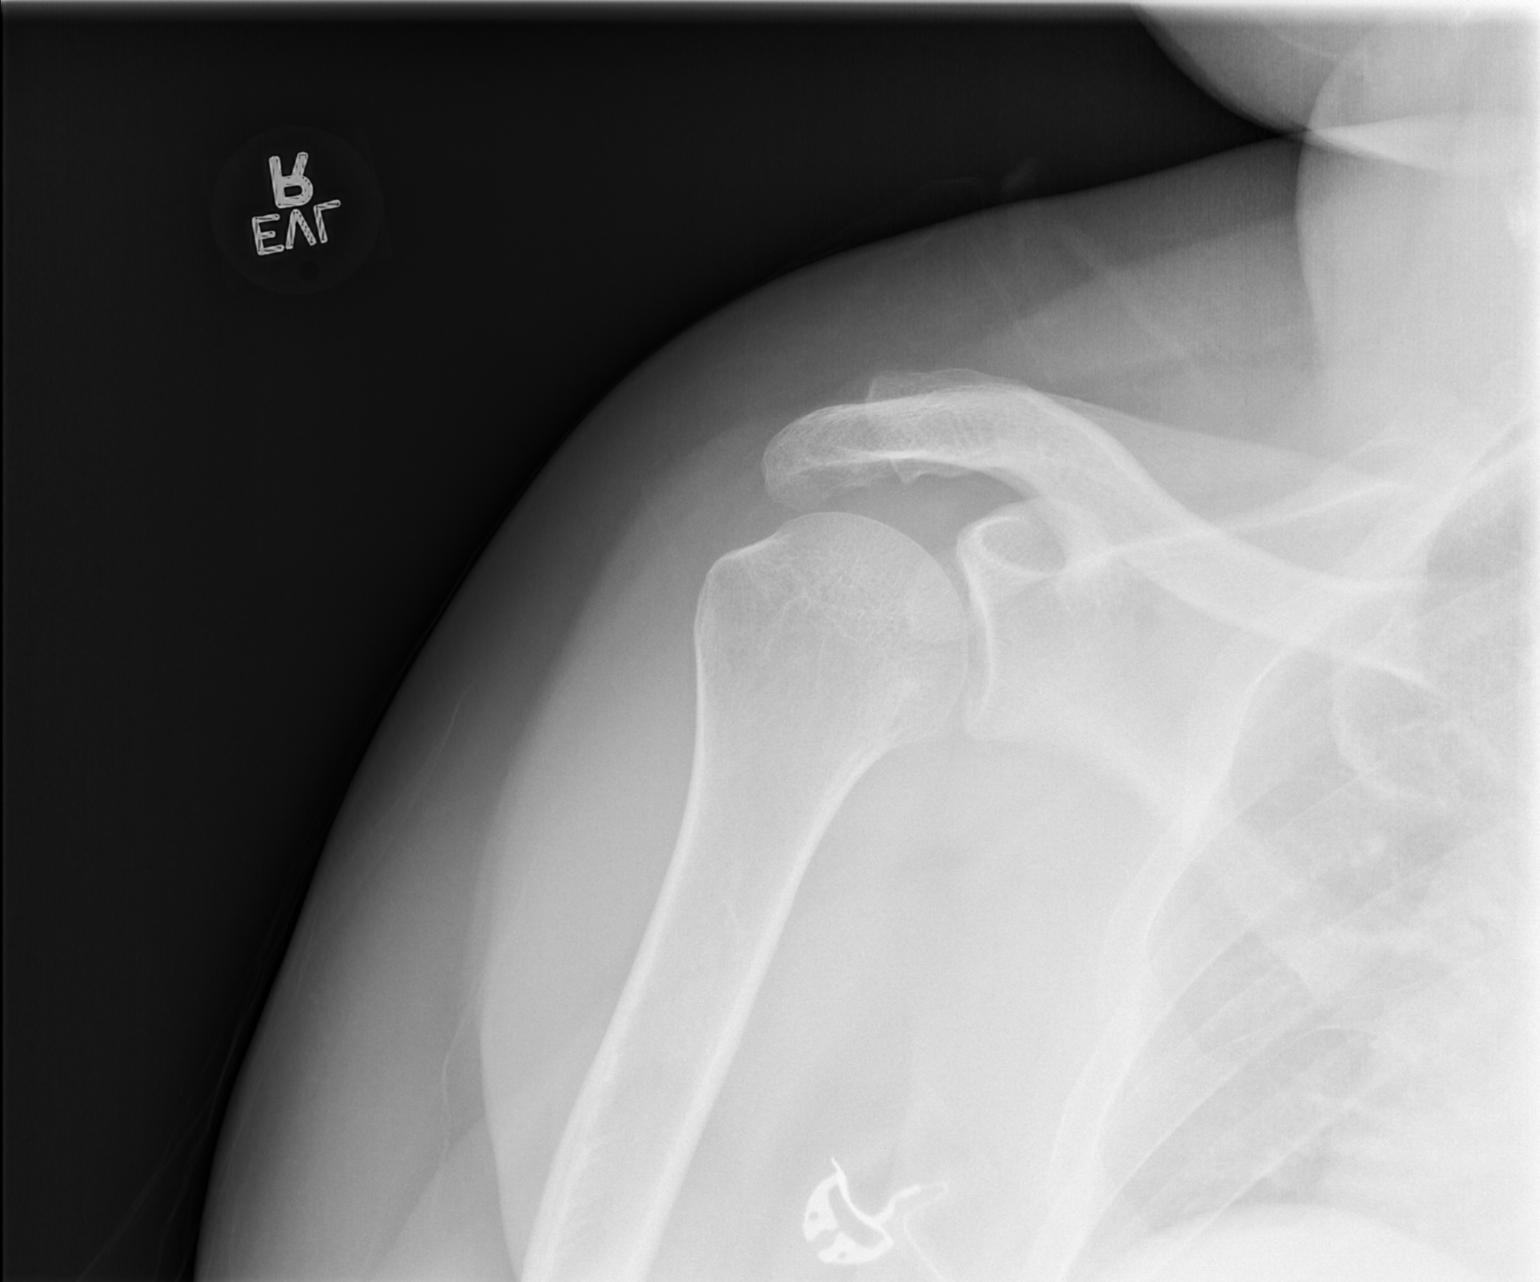

[1 of 1 positions shown; findings below may reference images not displayed]

FINDINGS: The humeral head is well-formed and located. The subacromial,
glenohumeral and acromioclavicular joint spaces are intact. No
destructive bony lesions. Soft tissue planes are non-suspicious.
IMPRESSION: Negative.

## 2019-08-19 IMAGING — CT CT ABD-PELV W/ CM
2 of 5 series · 16 of 46 positions shown, 18 images · IV contrast (APPLIED)
Comparison: None.

CLINICAL DATA: RIGHT upper quadrant abdominal pain and fever for 3
weeks.

EXAM:
CT ABDOMEN AND PELVIS WITH CONTRAST
TECHNIQUE: Multidetector CT imaging of the abdomen and pelvis was performed
using the standard protocol following bolus administration of
intravenous contrast.
CONTRAST:  100mL AQWHL1-800 IOPAMIDOL (AQWHL1-800) INJECTION 61%

[Series 2: axial st · axial · 0.85mm/px · z∈[+326,+751]mm · 13 of 97 slices shown, 15 images]
[im 6/97  soft-tissue]
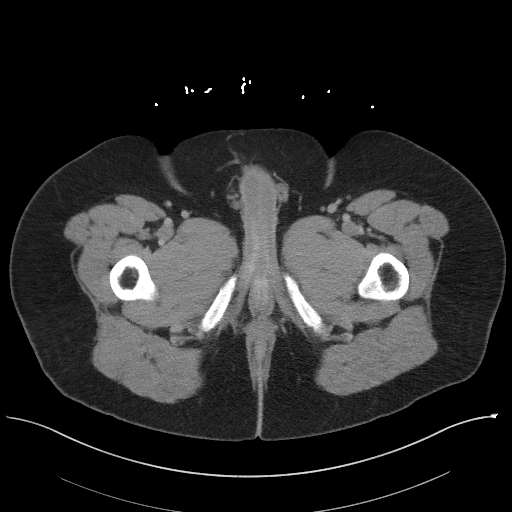
[im 6/97  bone]
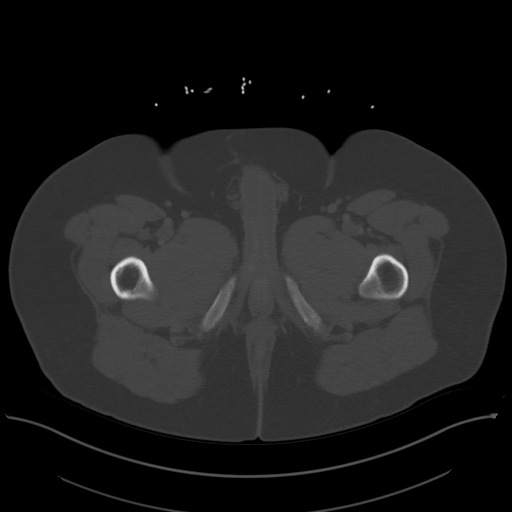
[im 11/97  soft-tissue]
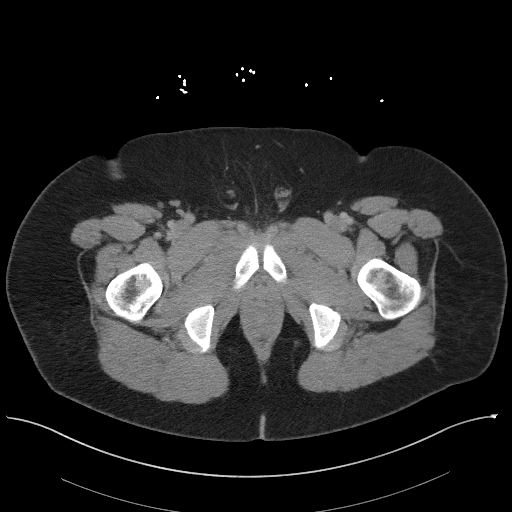
[im 22/97  soft-tissue]
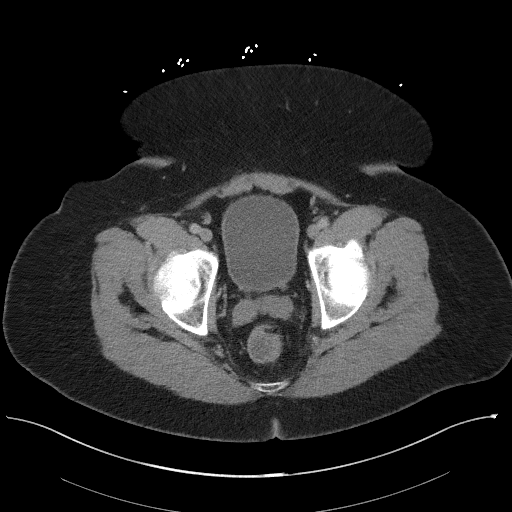
[im 27/97  soft-tissue]
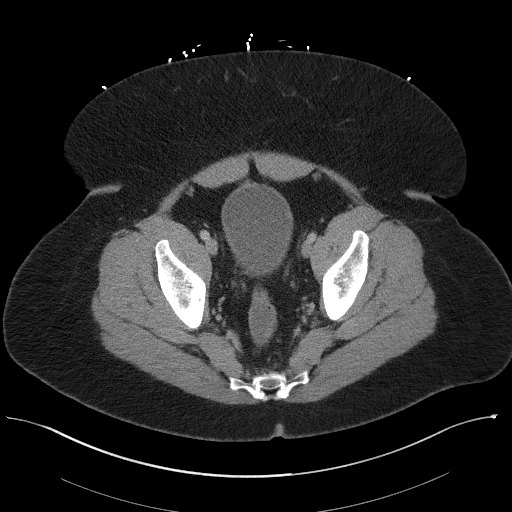
[im 33/97  soft-tissue]
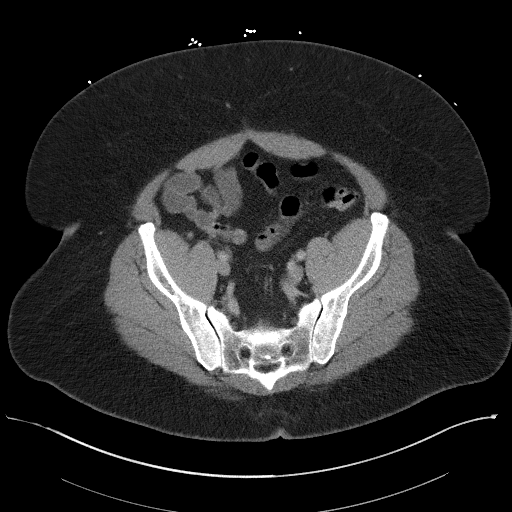
[im 43/97  soft-tissue]
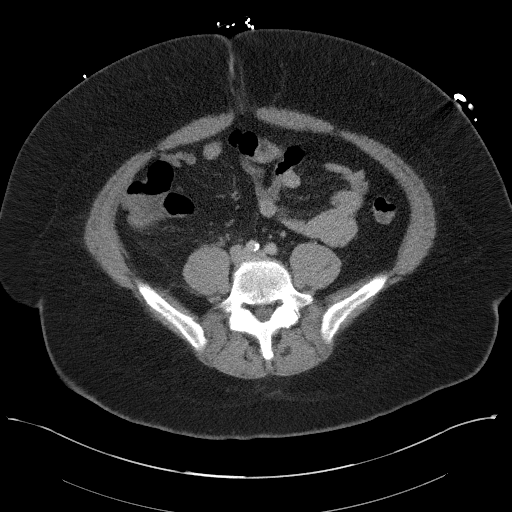
[im 49/97  soft-tissue]
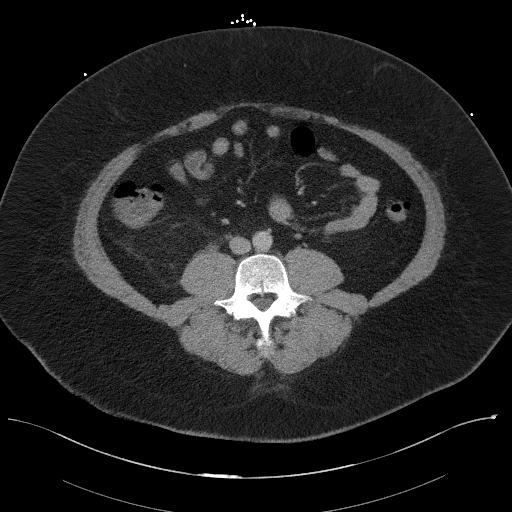
[im 54/97  soft-tissue]
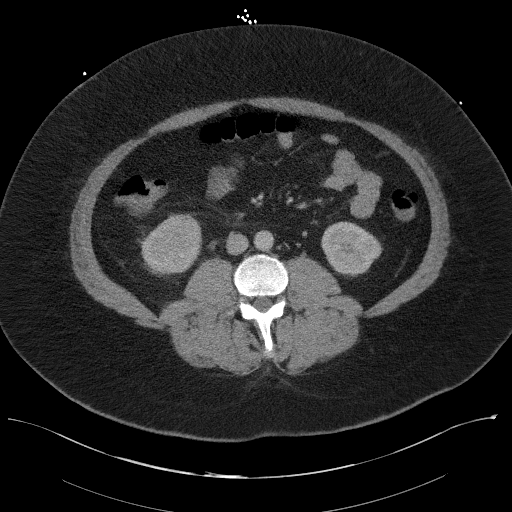
[im 65/97  soft-tissue]
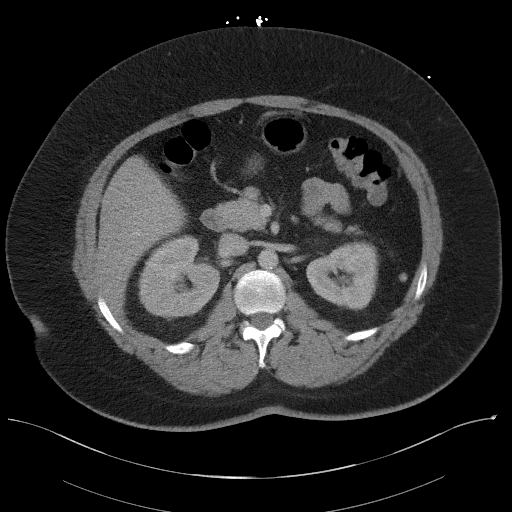
[im 65/97  bone]
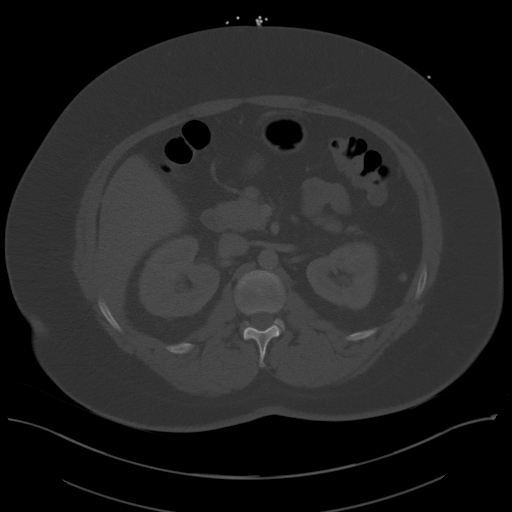
[im 70/97  soft-tissue]
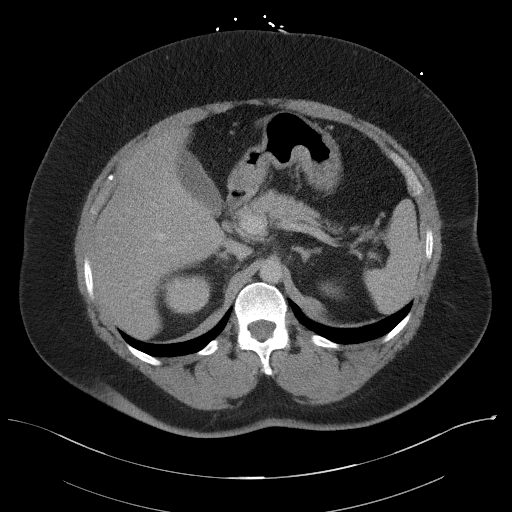
[im 75/97  soft-tissue]
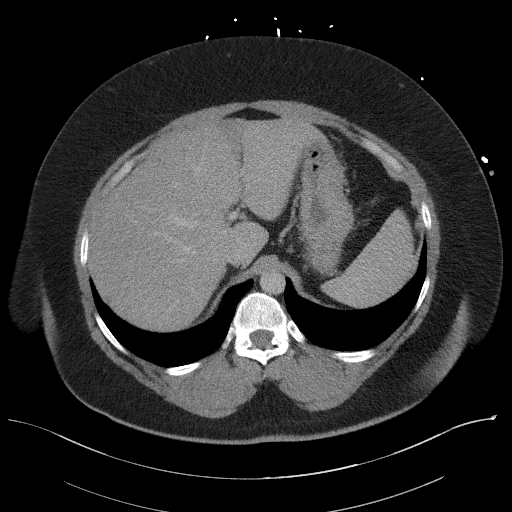
[im 86/97  soft-tissue]
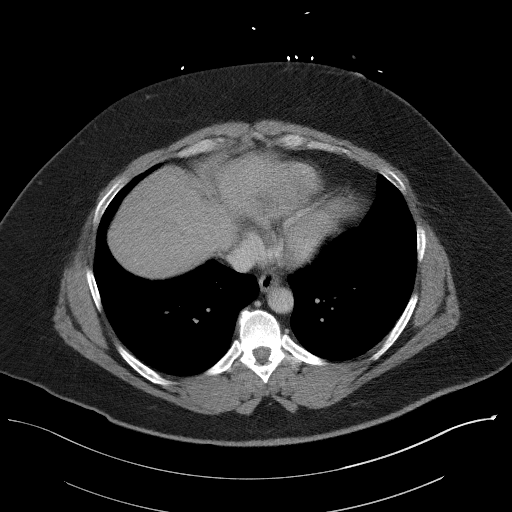
[im 91/97  soft-tissue]
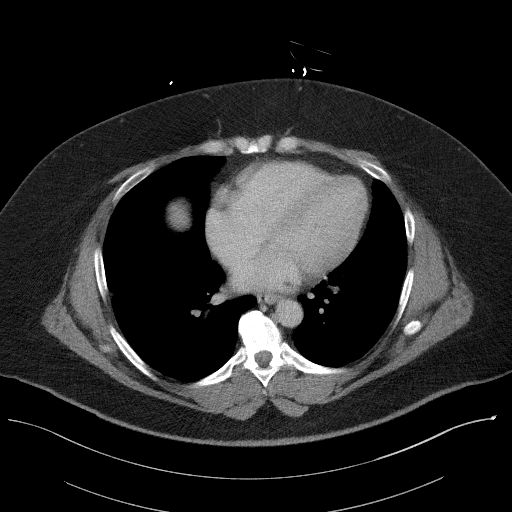

[Series 5: coronal st · coronal · 0.80mm/px · 3 of 111 slices shown]
[im 37/111  soft-tissue]
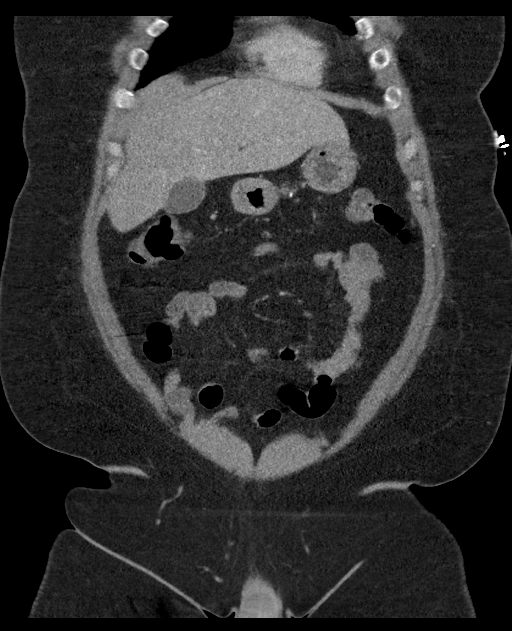
[im 49/111  soft-tissue]
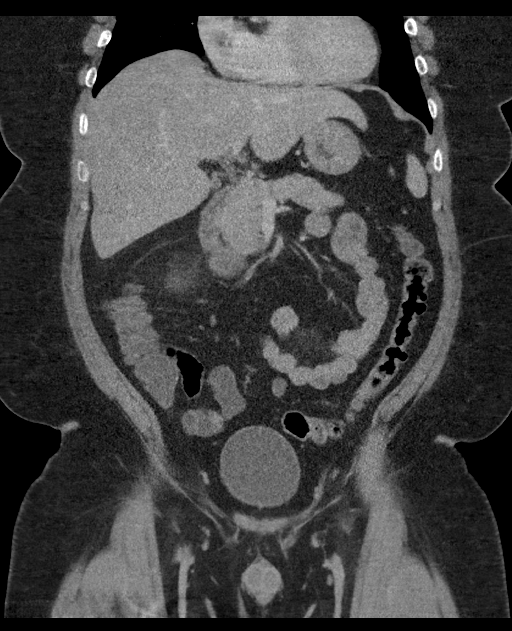
[im 62/111  soft-tissue]
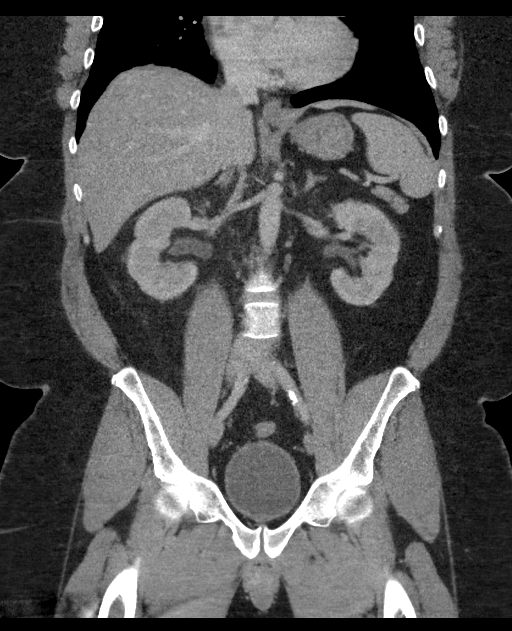

[16 of 46 positions shown; findings below may reference images not displayed]

FINDINGS: Lower chest: No acute abnormality.

Hepatobiliary: No focal liver abnormality is seen. No gallstones,
gallbladder wall thickening, or biliary dilatation.

Pancreas: Unremarkable. No pancreatic ductal dilatation or
surrounding inflammatory changes.

Spleen: Normal in size without focal abnormality.

Adrenals/Urinary Tract: Ill-defined edema/inflammation within the
RIGHT perinephric space. Subtle heterogeneity of the RIGHT renal
cortex suggests associated cortical edema. Additional mild
inflammation/fluid stranding about the RIGHT ureter, suggesting
ascending infection. Bladder is unremarkable, partially
decompressed.

LEFT kidney appears normal. No LEFT-sided perinephric edema. LEFT
ureter appears normal. No ureteral or bladder calculi identified.

Stomach/Bowel: No dilated large or small bowel loops. No bowel wall
thickening or evidence of bowel wall inflammation seen. Appendix is
normal. Stomach appears normal, partially decompressed.

Vascular/Lymphatic: No significant vascular findings are present. No
enlarged abdominal or pelvic lymph nodes.

Reproductive: Prostate is unremarkable.

Other: No abscess collection seen.  No free intraperitoneal air.

Musculoskeletal: No acute or suspicious osseous finding.
IMPRESSION: 1. Findings are highly suggestive of ascending infection of the
RIGHT ureter and associated RIGHT-sided pyelonephritis. Findings
could alternatively represent recently passed stone, considered less
likely given patient's symptoms. Recommend correlation with
urinalysis.
2. Remainder of the abdomen and pelvis CT is unremarkable, as
detailed above.

## 2019-10-29 ENCOUNTER — Other Ambulatory Visit: Payer: Self-pay

## 2019-10-29 DIAGNOSIS — Z20822 Contact with and (suspected) exposure to covid-19: Secondary | ICD-10-CM

## 2019-11-01 LAB — NOVEL CORONAVIRUS, NAA: SARS-CoV-2, NAA: DETECTED — AB

## 2019-12-03 ENCOUNTER — Encounter (HOSPITAL_BASED_OUTPATIENT_CLINIC_OR_DEPARTMENT_OTHER): Payer: Self-pay

## 2019-12-03 ENCOUNTER — Other Ambulatory Visit: Payer: Self-pay

## 2019-12-03 ENCOUNTER — Emergency Department (HOSPITAL_BASED_OUTPATIENT_CLINIC_OR_DEPARTMENT_OTHER)
Admission: EM | Admit: 2019-12-03 | Discharge: 2019-12-03 | Disposition: A | Discharge status: Home / Self Care | Payer: Self-pay | Attending: Emergency Medicine | Admitting: Emergency Medicine

## 2019-12-03 ENCOUNTER — Emergency Department (HOSPITAL_BASED_OUTPATIENT_CLINIC_OR_DEPARTMENT_OTHER): Payer: Self-pay

## 2019-12-03 DIAGNOSIS — Z87891 Personal history of nicotine dependence: Secondary | ICD-10-CM | POA: Insufficient documentation

## 2019-12-03 DIAGNOSIS — Z79899 Other long term (current) drug therapy: Secondary | ICD-10-CM | POA: Insufficient documentation

## 2019-12-03 DIAGNOSIS — R0602 Shortness of breath: Secondary | ICD-10-CM | POA: Insufficient documentation

## 2019-12-03 DIAGNOSIS — Z8616 Personal history of COVID-19: Secondary | ICD-10-CM | POA: Insufficient documentation

## 2019-12-03 LAB — BASIC METABOLIC PANEL
Anion gap: 11 (ref 5–15)
BUN: 11 mg/dL (ref 6–20)
CO2: 26 mmol/L (ref 22–32)
Calcium: 9.4 mg/dL (ref 8.9–10.3)
Chloride: 104 mmol/L (ref 98–111)
Creatinine, Ser: 0.94 mg/dL (ref 0.61–1.24)
GFR calc Af Amer: >60 mL/min (ref >60)
GFR calc non Af Amer: >60 mL/min (ref >60)
Glucose, Bld: 103 mg/dL — ABNORMAL HIGH (ref 70–99)
Potassium: 4 mmol/L (ref 3.5–5.1)
Sodium: 141 mmol/L (ref 135–145)

## 2019-12-03 LAB — CBC WITH DIFFERENTIAL/PLATELET
Abs Immature Granulocytes: 0.01 10*3/uL (ref 0.00–0.07)
Basophils Absolute: 0.1 10*3/uL (ref 0.0–0.1)
Basophils Relative: 1 %
Eosinophils Absolute: 0.1 10*3/uL (ref 0.0–0.5)
Eosinophils Relative: 2 %
HCT: 41.7 % (ref 39.0–52.0)
Hemoglobin: 12.9 g/dL — ABNORMAL LOW (ref 13.0–17.0)
Immature Granulocytes: 0 %
Lymphocytes Relative: 35 %
Lymphs Abs: 1.8 10*3/uL (ref 0.7–4.0)
MCH: 29.8 pg (ref 26.0–34.0)
MCHC: 30.9 g/dL (ref 30.0–36.0)
MCV: 96.3 fL (ref 80.0–100.0)
Monocytes Absolute: 0.7 10*3/uL (ref 0.1–1.0)
Monocytes Relative: 13 %
Neutro Abs: 2.5 10*3/uL (ref 1.7–7.7)
Neutrophils Relative %: 49 %
Platelets: 346 10*3/uL (ref 150–400)
RBC: 4.33 MIL/uL (ref 4.22–5.81)
RDW: 12.6 % (ref 11.5–15.5)
WBC: 5.2 10*3/uL (ref 4.0–10.5)
nRBC: 0 % (ref 0.0–0.2)

## 2019-12-03 LAB — D-DIMER, QUANTITATIVE: D-Dimer, Quant: 0.37 ug/mL-FEU (ref 0.00–0.50)

## 2019-12-03 LAB — TROPONIN I (HIGH SENSITIVITY): Troponin I (High Sensitivity): 2 ng/L (ref <18)

## 2019-12-03 NOTE — ED Provider Notes (Signed)
[facility] EMERGENCY DEPARTMENT Provider Note   CSN: [id number] Arrival date & time: 12/03/19  1144     History Chief Complaint  Patient presents with  . Shortness of Breath  . Chest Pain    Nicholas Miller is a 59 y.o. male who presents to the ED today with continued shortness of breath worse with exertion since being diagnosed with COVID 19 on 10/29/2019. Pt reports his wife was tested and was positive which prompted pt to go get tested; He was informed he was positive a couple of days after being tested. He states that while quarantining for 14 days he had dyspnea on exertion with intermittent chest pain, cough, and wheezing. Pt states that he has stayed at home over the holidays and did not attempt to return to work until today. 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 Pt was concerned as he is now a month out of being diagnosed but still having symptoms. Pt states his symptoms have not changed and they are no better or worse than when he first began having symptoms. Pt states he felt very hot last night as well and checked his temperature which was 99.9. Pt's temp in the ED today 98.8. He denies chills, coughing up blood, diarrhea, leg swelling/pain, dizziness/lightheadedness, or any other associated symptoms.   The history is provided by the patient and medical records.       Past Medical History:  Diagnosis Date  . Pyelonephritis 07/25/2018    Patient Active Problem List   Diagnosis Date Noted  . Pyelonephritis 07/25/2018  . Hyponatremia 07/25/2018  . Renal insufficiency 07/25/2018  . Hyperbilirubinemia 07/25/2018  . Normocytic anemia 07/25/2018  . Diarrhea with dehydration 07/25/2018    Past Surgical History:  Procedure Laterality Date  . BIOPSY  07/28/2018   Procedure: BIOPSY;  Surgeon: Carol Ada, MD;  Location: [facility] ENDOSCOPY;  Service: Endoscopy;;  .  COLONOSCOPY N/A 07/28/2018   Procedure: COLONOSCOPY;  Surgeon: Carol Ada, MD;  Location: [facility];  Service: Endoscopy;  Laterality: N/A;  . ESOPHAGOGASTRODUODENOSCOPY N/A 07/28/2018   Procedure: ESOPHAGOGASTRODUODENOSCOPY (EGD);  Surgeon: Carol Ada, MD;  Location: [facility];  Service: Endoscopy;  Laterality: N/A;  . INGUINAL HERNIA REPAIR Left ~ 1972  . POLYPECTOMY  07/28/2018   Procedure: POLYPECTOMY;  Surgeon: Carol Ada, MD;  Location: [facility] ENDOSCOPY;  Service: Endoscopy;;       Family History  Problem Relation Age of Onset  . Diabetes Mother     Social History   Tobacco Use  . Smoking status: Former Smoker    Packs/day: 0.50    Years: 1.00    Pack years: 0.50    Types: Cigarettes  . Smokeless tobacco: Never Used  Substance Use Topics  . Alcohol use: Never  . Drug use: Never    Home Medications Prior to Admission medications   Medication Sig Start Date End Date Taking? Authorizing Provider  ferrous sulfate 325 (65 FE) MG tablet Take 1 tablet (325 mg total) by mouth 2 (two) times daily with a meal. 07/28/18   Shelly Coss, MD  pantoprazole (PROTONIX) 40 MG tablet Take 1 tablet (40 mg total) by mouth daily. 07/28/18   Shelly Coss, MD    Allergies    Patient has no known allergies.  Review of Systems   Review of Systems  Constitutional: Positive for fatigue and fever. Negative for chills.  Respiratory: Positive for cough, shortness of breath and wheezing.   Cardiovascular: Positive for chest pain. Negative for palpitations and leg swelling.  All other systems reviewed and are negative.   Physical Exam Updated Vital Signs BP (!) 151/99 (BP Location: Left Arm)   Pulse 100   Temp 98.8 F (37.1 C) (Oral)   Resp 20   Ht 5\' 8"  (1.727 m)   Wt 113.9 kg   SpO2 98%   BMI 38.16 kg/m   Physical Exam Vitals and nursing note reviewed.  Constitutional:      Appearance: He is obese. He is not ill-appearing or diaphoretic.  HENT:     Head: Normocephalic  and atraumatic.  Eyes:     Conjunctiva/sclera: Conjunctivae normal.  Cardiovascular:     Rate and Rhythm: Normal rate and regular rhythm.     Pulses: Normal pulses.  Pulmonary:     Effort: Pulmonary effort is normal.     Breath sounds: Normal breath sounds. No decreased breath sounds, wheezing, rhonchi or rales.  Chest:     Chest wall: No tenderness.  Abdominal:     Palpations: Abdomen is soft.     Tenderness: There is no abdominal tenderness.  Musculoskeletal:     Cervical back: Neck supple.     Right lower leg: No tenderness. No edema.     Left lower leg: No tenderness. No edema.  Skin:    General: Skin is warm and dry.  Neurological:     Mental Status: He is alert.     ED Results / Procedures / Treatments   Labs (all labs ordered are listed, but only abnormal results are displayed) Labs Reviewed  BASIC METABOLIC PANEL - Abnormal; Notable for the following components:      Result Value   Glucose, Bld 103 (*)    All other components within normal limits  CBC WITH DIFFERENTIAL/PLATELET - Abnormal; Notable for the following components:   Hemoglobin 12.9 (*)    All other components within normal limits  D-DIMER, QUANTITATIVE (NOT AT [facility])  TROPONIN I (HIGH SENSITIVITY)    EKG None  Radiology DG Chest Portable 1 View  Result Date: 12/03/2019 CLINICAL DATA:  Cough, wheezing, COVID positive EXAM: PORTABLE CHEST 1 VIEW COMPARISON:  07/25/2018 FINDINGS: The heart size and mediastinal contours are within normal limits. Both lungs are clear. The visualized skeletal structures are unremarkable. IMPRESSION: No acute abnormality of the lungs in AP portable projection. Electronically Signed   By: Eddie Candle M.D.   On: 12/03/2019 12:49    Procedures Procedures (including critical care time)  Medications Ordered in ED Medications - No data to display  ED Course  I have reviewed the triage vital signs and the nursing notes.  Pertinent labs & imaging results that were  available during my care of the patient were reviewed by me and considered in my medical decision making (see chart for details).  59 year old male presents the ED today with complaint of continued as of breath and exertional chest pain since being diagnosed with Covid on 12/02.  Patient states that he has stayed home in quarantine for 14 days and then stayed at home during the holidays and attempted to go back to work this week.  Patient states that he does a lot of heavy lifting at work and has felt more short of breath as of late.  States he is here for further evaluation.  Patient's vitals are stable on arrival.  He is afebrile without tachycardia or tachypnea.  He is satting 100% on room air.  He is able to speak in full sentences without difficulty without accessory muscle use.  Will obtain screening labs, EKG, chest x-ray, troponin, D-dimer.  If any abnormalities in D-dimer will consider CTA as patient has increased risk with his recent COVID-19 infection for PE.  It is back to the patient may just be having long-term sequela from recent COVID-19 positive exposure.  He may have been deconditioned from staying at home and it is difficult for him to have heavy lifting duties at work after being deconditioned.   EKG without ischemic changes today and appears unchanged from previous.  EKG evaluated by attending physician Dr. Nathen May who agrees.  CBC without leukocytosis.  No electrolyte abnormalities on BMP.  Initial troponin of 2.  Do not feel patient needs repeat troponin at this time given his symptoms been ongoing for months since his recent COVID-19 positive infection.  D-dimer 0.37.  Do not feel patient needs CTA at this time.  Patient has been ambulated in the ED and his oxygen saturation remained 99%.   Lab work reassuring at this time.  Advised that he may need to discuss limited activity at work to help him get back to his baseline although I had lengthy discussion with patient that he may never  get back to where he was at prior to his COVID-19 infection.  He is advised to follow-up with his PCP if he chooses to seek short-term disability.  Patient is in agreement.  All questions answered in a timely manner.  Strict return precautions have been discussed.  Patient stable for discharge.  This note was prepared using Dragon voice recognition software and may include unintentional dictation errors due to the inherent limitations of voice recognition software.  Nicholas Miller was evaluated in Emergency Department on 12/03/2019 for the symptoms described in the history of present illness. He was evaluated in the context of the global COVID-19 pandemic, which necessitated consideration that the patient might be at risk for infection with the SARS-CoV-2 virus that causes COVID-19. Institutional protocols and algorithms that pertain to the evaluation of patients at risk for COVID-19 are in a state of rapid change based on information released by regulatory bodies including the CDC and federal and state organizations. These policies and algorithms were followed during the patient's care in the ED.   Clinical Course as of Dec 02 1742  Wed Dec 03, 2019  1541 D-Dimer, Quant: 0.37 [MV]    Clinical Course User Index [MV] Eustaquio Maize, PA-C   MDM Rules/Calculators/A&P                       Final Clinical Impression(s) / ED Diagnoses Final diagnoses:  History of 2019 novel coronavirus disease (COVID-19)  Shortness of breath    Rx / DC Orders ED Discharge Orders    None       Discharge Instructions     Your labwork, chest xray, and EKG were reassuring today. You may still be experiencing some long term effects from your recent COVID 19 infection. Please follow up with your PCP to discuss options in regards to short term disability from work should you feel you are unable to do your job adequately.   Return to the ED for any worsening symptoms including worsening shortness of breath, chest  pain, coughing up blood, passing out.        Eustaquio Maize, PA-C 12/03/19 1744    Malvin Johns, MD 12/03/19  2326  

## 2019-12-03 NOTE — ED Triage Notes (Signed)
Pt c/o cont'd cough, wheezing, congestion-pt states he was +covid in Nov- quarantined for 10 days-NAD-steady gait

## 2019-12-03 NOTE — ED Notes (Signed)
Ambulated in room.  SpO2 99-100%, HR 88-98, RR 20-22. Denies DOE or increased WOB.

## 2019-12-03 NOTE — Discharge Instructions (Addendum)
Your labwork, chest xray, and EKG were reassuring today. You may still be experiencing some long term effects from your recent COVID 19 infection. Please follow up with your PCP to discuss options in regards to short term disability from work should you feel you are unable to do your job adequately.   Return to the ED for any worsening symptoms including worsening shortness of breath, chest pain, coughing up blood, passing out.

## 2020-01-29 ENCOUNTER — Ambulatory Visit: Payer: Self-pay | Attending: Internal Medicine

## 2020-01-29 DIAGNOSIS — Z23 Encounter for immunization: Secondary | ICD-10-CM | POA: Insufficient documentation

## 2020-01-29 NOTE — Progress Notes (Signed)
   Covid-19 Vaccination Clinic  Name:  Nicholas Miller    MRN: [id number] DOB: 1961-04-24  01/29/2020  [name] was observed post Covid-19 immunization for 15 minutes without incident. He was provided with Vaccine Information Sheet and instruction to access the V-Safe system.   [name] was instructed to call 911 with any severe reactions post vaccine: Marland Kitchen Difficulty breathing  . Swelling of face and throat  . A fast heartbeat  . A bad rash all over body  . Dizziness and weakness   Immunizations Administered    Name Date Dose VIS Date Route   Pfizer COVID-19 Vaccine 01/29/2020  3:20 PM 0.3 mL 11/07/2019 Intramuscular   Manufacturer: [place]   Lot: [id number]   [place]: [id number]

## 2020-02-25 ENCOUNTER — Ambulatory Visit: Payer: Self-pay | Attending: Internal Medicine

## 2020-02-25 DIAGNOSIS — Z23 Encounter for immunization: Secondary | ICD-10-CM

## 2020-02-25 NOTE — Progress Notes (Signed)
   Covid-19 Vaccination Clinic  Name:  Nicholas Miller    MRN: [id number] DOB: 1961/05/06  02/25/2020  [name] was observed post Covid-19 immunization for 15 minutes without incident. He was provided with Vaccine Information Sheet and instruction to access the V-Safe system.   [name] was instructed to call 911 with any severe reactions post vaccine: Marland Kitchen Difficulty breathing  . Swelling of face and throat  . A fast heartbeat  . A bad rash all over body  . Dizziness and weakness   Immunizations Administered    Name Date Dose VIS Date Route   Pfizer COVID-19 Vaccine 02/25/2020  8:28 AM 0.3 mL 11/07/2019 Intramuscular   Manufacturer: [place]   Lot: [id number]   [place]: [id number]

## 2020-04-21 ENCOUNTER — Telehealth: Payer: Self-pay | Admitting: Medical

## 2020-04-21 ENCOUNTER — Other Ambulatory Visit: Payer: Self-pay

## 2020-04-21 ENCOUNTER — Ambulatory Visit (INDEPENDENT_AMBULATORY_CARE_PROVIDER_SITE_OTHER): Payer: PRIVATE HEALTH INSURANCE | Admitting: Medical

## 2020-04-21 ENCOUNTER — Encounter: Payer: Self-pay | Admitting: Medical

## 2020-04-21 VITALS — BP 154/80 | HR 77 | Temp 96.9°F | Resp 18 | Ht 68.0 in | Wt 257.6 lb

## 2020-04-21 DIAGNOSIS — N289 Disorder of kidney and ureter, unspecified: Secondary | ICD-10-CM

## 2020-04-21 DIAGNOSIS — Z125 Encounter for screening for malignant neoplasm of prostate: Secondary | ICD-10-CM

## 2020-04-21 DIAGNOSIS — Z8719 Personal history of other diseases of the digestive system: Secondary | ICD-10-CM | POA: Diagnosis not present

## 2020-04-21 DIAGNOSIS — K760 Fatty (change of) liver, not elsewhere classified: Secondary | ICD-10-CM | POA: Diagnosis not present

## 2020-04-21 DIAGNOSIS — K635 Polyp of colon: Secondary | ICD-10-CM

## 2020-04-21 DIAGNOSIS — D649 Anemia, unspecified: Secondary | ICD-10-CM

## 2020-04-21 DIAGNOSIS — R944 Abnormal results of kidney function studies: Secondary | ICD-10-CM

## 2020-04-21 DIAGNOSIS — R739 Hyperglycemia, unspecified: Secondary | ICD-10-CM

## 2020-04-21 DIAGNOSIS — I10 Essential (primary) hypertension: Secondary | ICD-10-CM | POA: Diagnosis not present

## 2020-04-21 LAB — COMPREHENSIVE METABOLIC PANEL
ALT: 27 U/L (ref 0–53)
AST: 20 U/L (ref 0–37)
Albumin: 4.2 g/dL (ref 3.5–5.2)
Alkaline Phosphatase: 64 U/L (ref 39–117)
BUN: 11 mg/dL (ref 6–23)
CO2: 29 mEq/L (ref 19–32)
Calcium: 9.6 mg/dL (ref 8.4–10.5)
Chloride: 103 mEq/L (ref 96–112)
Creatinine, Ser: 1.04 mg/dL (ref 0.40–1.50)
GFR: 88.48 mL/min (ref >60.00)
Glucose, Bld: 105 mg/dL — ABNORMAL HIGH (ref 70–99)
Potassium: 4.9 mEq/L (ref 3.5–5.1)
Sodium: 138 mEq/L (ref 135–145)
Total Bilirubin: 0.5 mg/dL (ref 0.2–1.2)
Total Protein: 7.3 g/dL (ref 6.0–8.3)

## 2020-04-21 LAB — LIPID PANEL
Cholesterol: 236 mg/dL — ABNORMAL HIGH (ref 0–200)
HDL: 34.6 mg/dL — ABNORMAL LOW (ref >39.00)
LDL Cholesterol: 177 mg/dL — ABNORMAL HIGH (ref 0–99)
NonHDL: 201.11
Total CHOL/HDL Ratio: 7
Triglycerides: 122 mg/dL (ref 0.0–149.0)
VLDL: 24.4 mg/dL (ref 0.0–40.0)

## 2020-04-21 LAB — CBC WITH DIFFERENTIAL/PLATELET
Basophils Absolute: 0 10*3/uL (ref 0.0–0.1)
Basophils Relative: 0.7 % (ref 0.0–3.0)
Eosinophils Absolute: 0.1 10*3/uL (ref 0.0–0.7)
Eosinophils Relative: 2.4 % (ref 0.0–5.0)
HCT: 37.3 % — ABNORMAL LOW (ref 39.0–52.0)
Hemoglobin: 12.3 g/dL — ABNORMAL LOW (ref 13.0–17.0)
Lymphocytes Relative: 34.8 % (ref 12.0–46.0)
Lymphs Abs: 1.2 10*3/uL (ref 0.7–4.0)
MCHC: 33.1 g/dL (ref 30.0–36.0)
MCV: 93.4 fl (ref 78.0–100.0)
Monocytes Absolute: 0.4 10*3/uL (ref 0.1–1.0)
Monocytes Relative: 10.4 % (ref 3.0–12.0)
Neutro Abs: 1.8 10*3/uL (ref 1.4–7.7)
Neutrophils Relative %: 51.7 % (ref 43.0–77.0)
Platelets: 354 10*3/uL (ref 150.0–400.0)
RBC: 4 Mil/uL — ABNORMAL LOW (ref 4.22–5.81)
RDW: 12.7 % (ref 11.5–15.5)
WBC: 3.5 10*3/uL — ABNORMAL LOW (ref 4.0–10.5)

## 2020-04-21 LAB — PSA: PSA: 2.41 ng/mL (ref 0.10–4.00)

## 2020-04-21 LAB — HEMOGLOBIN A1C: Hgb A1c MFr Bld: 5.1 % (ref 4.6–6.5)

## 2020-04-21 MED ORDER — LOSARTAN POTASSIUM 25 MG PO TABS: ORAL_TABLET | ORAL | 3 refills | Status: AC

## 2020-04-21 MED ORDER — ATORVASTATIN CALCIUM 10 MG PO TABS: 10.0000 mg | ORAL_TABLET | Freq: Every day | ORAL | 3 refills | Status: AC

## 2020-04-21 NOTE — Telephone Encounter (Signed)
Rx atorvastatin sent to pt pharmacy. 

## 2020-04-21 NOTE — Patient Instructions (Signed)
For hypertension, I prescribed losartan 25 mg.  Continue low-salt diet and daily exercise.  For fatty liver we will get metabolic panel and assess liver enzymes.  Continue healthy diet and weight loss.  History of GERD but controlled now with diet alone.  For history of decreased GFR, will assess kidney function on metabolic panel.  For mild elevated sugars in the past will get A1c.  We will get screening PSA today.  For history of anemia per chart review will get CBC.  Including lipid panel today on blood work due to history of hypertension.  History of colon polyp follow GI MD guidelines on repeat.  Follow-up in 3 weeks or as needed.

## 2020-04-21 NOTE — Progress Notes (Signed)
Subjective:    Patient ID: Nicholas Miller, male    DOB: [birth year], 59 y.o.   MRN: [id number]  HPI   Pt in for first time with me. Live in area. No prior pcp.  Pt states he was worked for [employer]. He is not working. Stopped working during pandemic. Pt does exercise regularly about 3 days a week. No caffeine beverage and avoid sodas. Pt is eating healthy recently. No alcohol. Non smoker presently. In his 16 off and on light smoker for 2-3 years.    He states he did get covid in the past. He got over infection. Pt has also had vaccine.Pt hesitant to return to work since he strongly suspect got at work and he was out of work for 2 months.   On review he has of fatty liver, renal insufficiency, anemia, pyelonephritis with small kidney stone, gerd and renal insufficiency.  Hx of elevated bp in past but was never put on medication. Pt has been checking his bp over past month. When he checks has consistently bp 150/90 or higher.  Up to date on colonoscopy.   Review of Systems  Constitutional: Negative for chills, fatigue and fever.  Eyes: Negative for photophobia.  Respiratory: Negative for cough, shortness of breath and wheezing.   Cardiovascular: Negative for chest pain, palpitations and leg swelling.  Gastrointestinal: Negative for abdominal pain.  Musculoskeletal: Negative for neck pain.  Skin: Negative for rash.  Neurological: Negative for dizziness, tremors, seizures and weakness.  Psychiatric/Behavioral: Negative for behavioral problems. The patient is not nervous/anxious.    Past Medical History:  Diagnosis Date  . Anemia   . GERD (gastroesophageal reflux disease)    conrolled diet.  . Hypertension   . Pyelonephritis 07/25/2018     Social History   Socioeconomic History  . Marital status: Married    Spouse name: Not on file  . Number of children: Not on file  . Years of education: Not on file  . Highest education level: Not on file    Occupational History  . Not on file  Tobacco Use  . Smoking status: Former Smoker    Packs/day: 0.50    Years: 1.00    Pack years: 0.50    Types: Cigarettes    Quit date: 04/21/1996    Years since quitting: 24.0  . Smokeless tobacco: Never Used  Substance and Sexual Activity  . Alcohol use: Never  . Drug use: Never  . Sexual activity: Yes  Other Topics Concern  . Not on file  Social History Narrative  . Not on file   Social Determinants of Health   Financial Resource Strain:   . Difficulty of Paying Living Expenses:   Food Insecurity:   . Worried About Charity fundraiser in the Last Year:   . Arboriculturist in the Last Year:   Transportation Needs:   . Film/video editor (Medical):   Marland Kitchen Lack of Transportation (Non-Medical):   Physical Activity:   . Days of Exercise per Week:   . Minutes of Exercise per Session:   Stress:   . Feeling of Stress :   Social Connections:   . Frequency of Communication with Friends and Family:   . Frequency of Social Gatherings with Friends and Family:   . Attends Religious Services:   . Active Member of Clubs or Organizations:   . Attends Archivist Meetings:   Marland Kitchen Marital Status:   Intimate Partner Violence:   .  Fear of Current or Ex-Partner:   . Emotionally Abused:   Marland Kitchen Physically Abused:   . Sexually Abused:     Past Surgical History:  Procedure Laterality Date  . BIOPSY  07/28/2018   Procedure: BIOPSY;  Surgeon: Carol Ada, MD;  Location: [facility] ENDOSCOPY;  Service: Endoscopy;;  . COLONOSCOPY N/A 07/28/2018   Procedure: COLONOSCOPY;  Surgeon: Carol Ada, MD;  Location: [place];  Service: Endoscopy;  Laterality: N/A;  . ESOPHAGOGASTRODUODENOSCOPY N/A 07/28/2018   Procedure: ESOPHAGOGASTRODUODENOSCOPY (EGD);  Surgeon: Carol Ada, MD;  Location: [facility];  Service: Endoscopy;  Laterality: N/A;  . INGUINAL HERNIA REPAIR Left ~ 1972  . POLYPECTOMY  07/28/2018   Procedure: POLYPECTOMY;  Surgeon: Carol Ada,  MD;  Location: [facility] ENDOSCOPY;  Service: Endoscopy;;    Family History  Problem Relation Age of Onset  . Diabetes Mother     No Known Allergies  Current Outpatient Medications on File Prior to Visit  Medication Sig Dispense Refill  . ferrous sulfate 325 (65 FE) MG tablet Take 1 tablet (325 mg total) by mouth 2 (two) times daily with a meal. (Patient not taking: Reported on 04/21/2020) 60 tablet 1  . pantoprazole (PROTONIX) 40 MG tablet Take 1 tablet (40 mg total) by mouth daily. (Patient not taking: Reported on 04/21/2020) 30 tablet 0   No current facility-administered medications on file prior to visit.    BP (!) 154/80 (BP Location: Left Arm, Patient Position: Sitting, Cuff Size: Large)   Pulse 77   Temp (!) 96.9 F (36.1 C) (Temporal)   Resp 18   Ht 5\' 8"  (1.727 m)   Wt 257 lb 9.6 oz (116.8 kg)   SpO2 99%   BMI 39.17 kg/m       Objective:   Physical Exam  General Mental Status- Alert. General Appearance- Not in acute distress.   Skin General: Color- Normal Color. Moisture- Normal Moisture.  Neck Carotid Arteries- Normal color. Moisture- Normal Moisture. No carotid bruits. No JVD.  Chest and Lung Exam Auscultation: Breath Sounds:-Normal.  Cardiovascular Auscultation:Rythm- Regular. Murmurs & Other Heart Sounds:Auscultation of the heart reveals- No Murmurs.  Abdomen Inspection:-Inspeection Normal. Palpation/Percussion:Note:No mass. Palpation and Percussion of the abdomen reveal- Non Tender, Non Distended + BS, no rebound or guarding.    Neurologic Cranial Nerve exam:- CN III-XII intact(No nystagmus), symmetric smile. Strength:- 5/5 equal and symmetric strength both upper and lower extremities.      Assessment & Plan:  For hypertension, I prescribed losartan 25 mg.  Continue low-salt diet and daily exercise.  For fatty liver we will get metabolic panel and assess liver enzymes.  Continue healthy diet and weight loss.  History of GERD but controlled now  with diet alone.  For history of decreased GFR, will assess kidney function on metabolic panel.  For mild elevated sugars in the past will get A1c.  We will get screening PSA today.  For history of anemia per chart review will get CBC.  Including lipid panel today on blood work due to history of hypertension.  History of colon polyp follow GI MD guidelines on repeat.  Follow-up in 3 weeks or as needed.  Mackie Pai, PA-C   Time spent with patient today was 35  minutes which consisted of chart review, discussing diagnoses, work up, treatment and documentation.

## 2020-05-12 ENCOUNTER — Ambulatory Visit: Payer: PRIVATE HEALTH INSURANCE | Admitting: Medical

## 2020-05-19 ENCOUNTER — Ambulatory Visit: Payer: PRIVATE HEALTH INSURANCE | Admitting: Medical

## 2020-05-20 ENCOUNTER — Ambulatory Visit: Payer: PRIVATE HEALTH INSURANCE | Admitting: Medical

## 2020-05-20 DIAGNOSIS — Z0289 Encounter for other administrative examinations: Secondary | ICD-10-CM

## 2020-05-25 ENCOUNTER — Ambulatory Visit: Payer: PRIVATE HEALTH INSURANCE | Admitting: Medical

## 2020-05-25 DIAGNOSIS — Z0289 Encounter for other administrative examinations: Secondary | ICD-10-CM

## 2020-08-06 ENCOUNTER — Other Ambulatory Visit: Payer: Self-pay

## 2020-08-06 ENCOUNTER — Encounter (HOSPITAL_BASED_OUTPATIENT_CLINIC_OR_DEPARTMENT_OTHER): Payer: Self-pay | Admitting: *Deleted

## 2020-08-06 ENCOUNTER — Emergency Department (HOSPITAL_BASED_OUTPATIENT_CLINIC_OR_DEPARTMENT_OTHER)
Admission: EM | Admit: 2020-08-06 | Discharge: 2020-08-06 | Disposition: A | Discharge status: Home / Self Care | Payer: PRIVATE HEALTH INSURANCE | Attending: Emergency Medicine | Admitting: Emergency Medicine

## 2020-08-06 DIAGNOSIS — Z87891 Personal history of nicotine dependence: Secondary | ICD-10-CM | POA: Insufficient documentation

## 2020-08-06 DIAGNOSIS — R5383 Other fatigue: Secondary | ICD-10-CM | POA: Insufficient documentation

## 2020-08-06 DIAGNOSIS — I10 Essential (primary) hypertension: Secondary | ICD-10-CM | POA: Insufficient documentation

## 2020-08-06 DIAGNOSIS — Z79899 Other long term (current) drug therapy: Secondary | ICD-10-CM | POA: Insufficient documentation

## 2020-08-06 DIAGNOSIS — Z20822 Contact with and (suspected) exposure to covid-19: Secondary | ICD-10-CM | POA: Insufficient documentation

## 2020-08-06 DIAGNOSIS — R509 Fever, unspecified: Secondary | ICD-10-CM

## 2020-08-06 LAB — CBC WITH DIFFERENTIAL/PLATELET
Abs Immature Granulocytes: 0.02 10*3/uL (ref 0.00–0.07)
Basophils Absolute: 0 10*3/uL (ref 0.0–0.1)
Basophils Relative: 1 %
Eosinophils Absolute: 0 10*3/uL (ref 0.0–0.5)
Eosinophils Relative: 1 %
HCT: 38.6 % — ABNORMAL LOW (ref 39.0–52.0)
Hemoglobin: 12.1 g/dL — ABNORMAL LOW (ref 13.0–17.0)
Immature Granulocytes: 1 %
Lymphocytes Relative: 22 %
Lymphs Abs: 1 10*3/uL (ref 0.7–4.0)
MCH: 29.5 pg (ref 26.0–34.0)
MCHC: 31.3 g/dL (ref 30.0–36.0)
MCV: 94.1 fL (ref 80.0–100.0)
Monocytes Absolute: 0.4 10*3/uL (ref 0.1–1.0)
Monocytes Relative: 10 %
Neutro Abs: 2.9 10*3/uL (ref 1.7–7.7)
Neutrophils Relative %: 65 %
Platelets: 351 10*3/uL (ref 150–400)
RBC: 4.1 MIL/uL — ABNORMAL LOW (ref 4.22–5.81)
RDW: 11.9 % (ref 11.5–15.5)
WBC: 4.4 10*3/uL (ref 4.0–10.5)
nRBC: 0 % (ref 0.0–0.2)

## 2020-08-06 LAB — URINALYSIS, ROUTINE W REFLEX MICROSCOPIC
Bilirubin Urine: NEGATIVE
Glucose, UA: NEGATIVE mg/dL
Ketones, ur: NEGATIVE mg/dL
Leukocytes,Ua: NEGATIVE
Nitrite: NEGATIVE
Protein, ur: NEGATIVE mg/dL
Specific Gravity, Urine: 1.025 (ref 1.005–1.030)
pH: 6 (ref 5.0–8.0)

## 2020-08-06 LAB — COMPREHENSIVE METABOLIC PANEL
ALT: 36 U/L (ref 0–44)
AST: 28 U/L (ref 15–41)
Albumin: 3.7 g/dL (ref 3.5–5.0)
Alkaline Phosphatase: 56 U/L (ref 38–126)
Anion gap: 10 (ref 5–15)
BUN: 11 mg/dL (ref 6–20)
CO2: 25 mmol/L (ref 22–32)
Calcium: 9.1 mg/dL (ref 8.9–10.3)
Chloride: 101 mmol/L (ref 98–111)
Creatinine, Ser: 1.08 mg/dL (ref 0.61–1.24)
GFR calc Af Amer: >60 mL/min (ref >60)
GFR calc non Af Amer: >60 mL/min (ref >60)
Glucose, Bld: 101 mg/dL — ABNORMAL HIGH (ref 70–99)
Potassium: 4.2 mmol/L (ref 3.5–5.1)
Sodium: 136 mmol/L (ref 135–145)
Total Bilirubin: 0.8 mg/dL (ref 0.3–1.2)
Total Protein: 7.9 g/dL (ref 6.5–8.1)

## 2020-08-06 LAB — URINALYSIS, MICROSCOPIC (REFLEX)

## 2020-08-06 LAB — SARS CORONAVIRUS 2 BY RT PCR (HOSPITAL ORDER, PERFORMED IN ~~LOC~~ HOSPITAL LAB): SARS Coronavirus 2: NEGATIVE

## 2020-08-06 NOTE — ED Provider Notes (Signed)
[facility] EMERGENCY DEPARTMENT Provider Note   CSN: [id number] Arrival date & time: 08/06/20  1355     History Chief Complaint  Patient presents with  . Fever    Nicholas Miller is a 59 y.o. male.  HPI Patient is a 59 year old male with a history of GERD, hypertension, pyelonephritis, hyponatremia, who presents to the emergency department due to intermittent fevers and chills.  Patient states that his symptoms started about 3 to 4 days ago.  He has been taking Tylenol for his fevers, with his last dose being yesterday evening.  Notes a T-max of 103 Fahrenheit.  He reports some associated nocturnal hyperhidrosis.  Patient confirms his listed medical history.  Denies any recent outdoor activities or exposures.  No known tick bites.  Denies any rashes, numbness, tingling, chest pain, shortness of breath, nausea, vomiting, diarrhea, dysuria, hematuria, penile discharge, joint swelling or pain, headaches.    Past Medical History:  Diagnosis Date  . Anemia   . GERD (gastroesophageal reflux disease)    conrolled diet.  . Hypertension   . Pyelonephritis 07/25/2018    Patient Active Problem List   Diagnosis Date Noted  . Pyelonephritis 07/25/2018  . Hyponatremia 07/25/2018  . Renal insufficiency 07/25/2018  . Hyperbilirubinemia 07/25/2018  . Normocytic anemia 07/25/2018  . Diarrhea with dehydration 07/25/2018    Past Surgical History:  Procedure Laterality Date  . BIOPSY  07/28/2018   Procedure: BIOPSY;  Surgeon: Carol Ada, MD;  Location: [facility] ENDOSCOPY;  Service: Endoscopy;;  . COLONOSCOPY N/A 07/28/2018   Procedure: COLONOSCOPY;  Surgeon: Carol Ada, MD;  Location: [facility];  Service: Endoscopy;  Laterality: N/A;  . ESOPHAGOGASTRODUODENOSCOPY N/A 07/28/2018   Procedure: ESOPHAGOGASTRODUODENOSCOPY (EGD);  Surgeon: Carol Ada, MD;  Location: [facility];  Service: Endoscopy;  Laterality: N/A;  . INGUINAL HERNIA REPAIR Left ~ 1972  . POLYPECTOMY  07/28/2018    Procedure: POLYPECTOMY;  Surgeon: Carol Ada, MD;  Location[facility] ENDOSCOPY;  Service: Endoscopy;;       Family History  Problem Relation Age of Onset  . Diabetes Mother     Social History   Tobacco Use  . Smoking status: Former Smoker    Packs/day: 0.50    Years: 1.00    Pack years: 0.50    Types: Cigarettes    Quit date: 04/21/1996    Years since quitting: 24.3  . Smokeless tobacco: Never Used  Vaping Use  . Vaping Use: Never used  Substance Use Topics  . Alcohol use: Never  . Drug use: Never    Home Medications Prior to Admission medications   Medication Sig Start Date End Date Taking? Authorizing Provider  atorvastatin (LIPITOR) 10 MG tablet Take 1 tablet (10 mg total) by mouth daily. 04/21/20   Saguier, Percell Miller, PA-C  ferrous sulfate 325 (65 FE) MG tablet Take 1 tablet (325 mg total) by mouth 2 (two) times daily with a meal. Patient not taking: Reported on 04/21/2020 07/28/18   Shelly Coss, MD  losartan (COZAAR) 25 MG tablet 1 tab po q day 04/21/20   Saguier, Percell Miller, PA-C  pantoprazole (PROTONIX) 40 MG tablet Take 1 tablet (40 mg total) by mouth daily. Patient not taking: Reported on 04/21/2020 07/28/18   Shelly Coss, MD    Allergies    Patient has no known allergies.  Review of Systems   Review of Systems  Constitutional: Positive for chills, fatigue and fever.  HENT: Negative for congestion, rhinorrhea, sinus pressure, sneezing and sore throat.   Respiratory: Negative for  cough, shortness of breath and wheezing.   Cardiovascular: Negative for chest pain.  Gastrointestinal: Negative for abdominal pain, diarrhea, nausea and vomiting.  Genitourinary: Negative for decreased urine volume, difficulty urinating, dysuria, hematuria, penile pain, penile swelling, scrotal swelling and urgency.  Musculoskeletal: Negative for arthralgias, joint swelling and myalgias.  Neurological: Negative for syncope and headaches.   Physical Exam Updated Vital Signs BP (!) 140/98    Pulse 97   Temp 100.3 F (37.9 C) (Oral)   Resp 16   Ht 5\' 8"  (1.727 m)   Wt 111.1 kg   SpO2 99%   BMI 37.25 kg/m   Physical Exam Vitals and nursing note reviewed.  Constitutional:      General: He is not in acute distress.    Appearance: Normal appearance. He is obese. He is not ill-appearing, toxic-appearing or diaphoretic.  HENT:     Head: Normocephalic and atraumatic.     Right Ear: External ear normal.     Left Ear: External ear normal.     Nose: Nose normal.     Mouth/Throat:     Mouth: Mucous membranes are moist.     Pharynx: Oropharynx is clear. No oropharyngeal exudate or posterior oropharyngeal erythema.     Comments: No erythema or exudates noted in the posterior oropharynx.  Readily handling secretions.  No hot potato voice. Eyes:     General: No scleral icterus.       Right eye: No discharge.        Left eye: No discharge.     Extraocular Movements: Extraocular movements intact.     Conjunctiva/sclera: Conjunctivae normal.  Cardiovascular:     Rate and Rhythm: Normal rate and regular rhythm.     Pulses: Normal pulses.     Heart sounds: Normal heart sounds. No murmur heard.  No friction rub. No gallop.      Comments: Regular rate and rhythm without any murmurs, rubs, gallops. Pulmonary:     Effort: Pulmonary effort is normal. No respiratory distress.     Breath sounds: Normal breath sounds. No stridor. No wheezing, rhonchi or rales.     Comments: Lungs are clear to auscultation bilaterally.  Good air movement. Abdominal:     General: Abdomen is flat.     Palpations: Abdomen is soft.     Tenderness: There is no abdominal tenderness.     Comments: Abdomen is soft and nontender.  Musculoskeletal:        General: No tenderness. Normal range of motion.     Cervical back: Normal range of motion and neck supple. No tenderness.     Right lower leg: No edema.     Left lower leg: No edema.  Skin:    General: Skin is warm and dry.  Neurological:     General: No  focal deficit present.     Mental Status: He is alert and oriented to person, place, and time.  Psychiatric:        Mood and Affect: Mood normal.        Behavior: Behavior normal.    ED Results / Procedures / Treatments   Labs (all labs ordered are listed, but only abnormal results are displayed) Labs Reviewed  COMPREHENSIVE METABOLIC PANEL - Abnormal; Notable for the following components:      Result Value   Glucose, Bld 101 (*)    All other components within normal limits  CBC WITH DIFFERENTIAL/PLATELET - Abnormal; Notable for the following components:   RBC 4.10 (*)  Hemoglobin 12.1 (*)    HCT 38.6 (*)    All other components within normal limits  URINALYSIS, ROUTINE W REFLEX MICROSCOPIC - Abnormal; Notable for the following components:   APPearance HAZY (*)    Hgb urine dipstick SMALL (*)    All other components within normal limits  URINALYSIS, MICROSCOPIC (REFLEX) - Abnormal; Notable for the following components:   Bacteria, UA MANY (*)    All other components within normal limits  SARS CORONAVIRUS 2 BY RT PCR (HOSPITAL ORDER, [place] [facility])  URINE CULTURE  CULTURE, BLOOD (SINGLE)   EKG None  Radiology No results found.  Procedures Procedures (including critical care time)  Medications Ordered in ED Medications - No data to display  ED Course  I have reviewed the triage vital signs and the nursing notes.  Pertinent labs & imaging results that were available during my care of the patient were reviewed by me and considered in my medical decision making (see chart for details).  Clinical Course as of Aug 06 1921  Fri Aug 06, 2020  1812 Temp: 99.7 F (37.6 C) [LJ]  1812 SARS Coronavirus 2: NEGATIVE [LJ]  1826 Similar to prior values.  Hemoglobin(!): 12.1 [LJ]  1826 No leukocytosis.  WBC: 4.4 [LJ]    Clinical Course User Index [LJ] Rayna Sexton, PA-C   MDM Rules/Calculators/A&P                          Pt is a 59 y.o. male  that presents with a history, physical exam, and ED Clinical Course as noted above.   Patient presents today with waxing and waning fevers, chills, fatigue, body aches for the past 4 days.  He has been vaccinated for COVID-19, has previously been infected with COVID-19, and was negative for COVID-19 today.  Besides the prior mentioned symptoms, patient has no other complaints at this time.  No recent known tick bites.  Patient denies that he spends much time outdoors.  No rashes or symptoms consistent with herpes zoster.  No joint pain or swelling.  Lab work today is generally reassuring.  Mildly decreased hemoglobin at 12.1, which appears to be baseline for patient.  UA does show many bacteria, 11-20 WBCs, mucus.  Patient is denying any urinary complaints.  He has a history of pyelonephritis as well as renal insufficiency.  Discussed lab work with patient in length.  Also discussed patient with my attending physician Dr. Atha Starks who recommends that we culture patient's urine as well as blood.  Recommended continued use of Tylenol for management of his fevers.  Patient given very strict return precautions and knows he needs to return to the ER with any new or worsening symptoms.  I recommended that he follow-up with his PCP for reevaluation.  Patient has been hypertensive at this visit.  No chest pain, shortness of breath, dizziness, lightheadedness.  Initial temperature was 100.3 Fahrenheit, which improved to 99.7 Fahrenheit without intervention.  Not hypoxic.  Not tachycardic.  Patient does not meet sepsis/SIRS criteria.  Patient is hemodynamically stable and in NAD at the time of d/c. Evaluation does not show pathology that would require ongoing emergent intervention or inpatient treatment. I explained the diagnosis to the patient. Patient is comfortable with above plan and is stable for discharge at this time. All questions were answered prior to disposition. Strict return precautions for  returning to the ED were discussed. Encouraged follow up with PCP.  An After Visit Summary was printed and given to the patient.  Patient discharged to home/self care.  Condition at discharge: Stable  Note: Portions of this report may have been transcribed using voice recognition software. Every effort was made to ensure accuracy; however, inadvertent computerized transcription errors may be present.   Final Clinical Impression(s) / ED Diagnoses Final diagnoses:  Fever and chills   Rx / DC Orders ED Discharge Orders    None       Rayna Sexton, PA-C 08/06/20 1929    Blanchie Dessert, MD 08/06/20 1947

## 2020-08-06 NOTE — ED Notes (Signed)
Pt. Reports he is here because he has felt very tired for the past 3-4 days with a fever off and on.  No reports of vomiting or diarrhea.  Pt. States he feels some better today but his wife really wanted him to come in today.  Pt.  In no distress and reports to RN he has been eating and drinking well.

## 2020-08-06 NOTE — Discharge Instructions (Addendum)
Please follow-up with your primary care provider.  Please come back to the ER if you develop new or worsening symptoms.  It was a pleasure to meet you.

## 2020-08-06 NOTE — ED Triage Notes (Signed)
Chills, Night sweats and fever for 4 days

## 2020-08-09 LAB — URINE CULTURE: Culture: >=100000 — AB

## 2020-08-10 ENCOUNTER — Telehealth: Payer: Self-pay | Admitting: Emergency Medicine

## 2020-08-10 NOTE — Telephone Encounter (Signed)
Post ED Visit - Positive Culture Follow-up: Successful Patient Follow-Up  Culture assessed and recommendations reviewed by:  []  Elenor Quinones, Pharm.D. []  Heide Guile, Pharm.D., BCPS AQ-ID []  Parks Neptune, Pharm.D., BCPS []  Alycia Rossetti, Pharm.D., BCPS []  [place], [place].D., BCPS, AAHIVP []  Legrand Como, Pharm.D., BCPS, AAHIVP []  Salome Arnt, PharmD, BCPS []  Johnnette Gourd, PharmD, BCPS []  Hughes Better, PharmD, BCPS []  Leeroy Cha, PharmD Romilda Garret PharmD  Positive urine culture  []  Patient discharged without antimicrobial prescription and treatment is now indicated []  Organism is resistant to prescribed ED discharge antimicrobial []  Patient with positive blood cultures  Results faxed to [facility] [address] [place] phone # [phone] Fax # [phone]   Hazle Nordmann 08/10/2020, 10:59 AM

## 2020-08-11 LAB — CULTURE, BLOOD (SINGLE): Culture: NO GROWTH

## 2020-08-12 ENCOUNTER — Telehealth: Payer: Self-pay | Admitting: Medical

## 2020-08-12 MED ORDER — CIPROFLOXACIN HCL 500 MG PO TABS: 500.0000 mg | ORAL_TABLET | Freq: Two times a day (BID) | ORAL | 0 refills | Status: DC

## 2020-08-12 NOTE — Telephone Encounter (Signed)
Pt seen in ED on 08/06/2020. Signs and symptoms that indicated infection potentialy. UA looked like possible infection. No tx given at discharge. Culture came paperwork place in stack of paperwork in files at work station. Reviewed 08/12/2020 at night Bacteria over 100,000. Will rx cipro 500 mg bid x 7 day. Ask staff to have him follow up on Monday or Tuesday.   Since I have not seen him has had infection for sometime without treat then return to ED if signs and symptoms worsen before I see him.  How is pt doing. Give me update after call.

## 2020-08-13 NOTE — Telephone Encounter (Signed)
Called pt and lvm to return call.  

## 2020-08-13 NOTE — Telephone Encounter (Signed)
Opened to send rx.

## 2020-08-13 NOTE — Telephone Encounter (Signed)
Opened to review 

## 2020-08-23 NOTE — Telephone Encounter (Signed)
Called pt and lvm to return call.  

## 2021-01-20 ENCOUNTER — Other Ambulatory Visit: Payer: Self-pay

## 2021-01-20 ENCOUNTER — Encounter (HOSPITAL_BASED_OUTPATIENT_CLINIC_OR_DEPARTMENT_OTHER): Payer: Self-pay

## 2021-01-20 ENCOUNTER — Emergency Department (HOSPITAL_BASED_OUTPATIENT_CLINIC_OR_DEPARTMENT_OTHER): Payer: Self-pay

## 2021-01-20 ENCOUNTER — Emergency Department (HOSPITAL_BASED_OUTPATIENT_CLINIC_OR_DEPARTMENT_OTHER)
Admission: EM | Admit: 2021-01-20 | Discharge: 2021-01-20 | Disposition: A | Discharge status: Home / Self Care | Payer: Self-pay | Attending: Emergency Medicine | Admitting: Emergency Medicine

## 2021-01-20 DIAGNOSIS — Z20822 Contact with and (suspected) exposure to covid-19: Secondary | ICD-10-CM | POA: Insufficient documentation

## 2021-01-20 DIAGNOSIS — Z87891 Personal history of nicotine dependence: Secondary | ICD-10-CM | POA: Insufficient documentation

## 2021-01-20 DIAGNOSIS — Z79899 Other long term (current) drug therapy: Secondary | ICD-10-CM | POA: Insufficient documentation

## 2021-01-20 DIAGNOSIS — J069 Acute upper respiratory infection, unspecified: Secondary | ICD-10-CM | POA: Insufficient documentation

## 2021-01-20 DIAGNOSIS — I10 Essential (primary) hypertension: Secondary | ICD-10-CM | POA: Insufficient documentation

## 2021-01-20 LAB — RAPID INFLUENZA A&B ANTIGENS
Influenza A (ARMC): NEGATIVE
Influenza B (ARMC): NEGATIVE

## 2021-01-20 NOTE — ED Provider Notes (Signed)
[facility] EMERGENCY DEPARTMENT Provider Note   CSN: [id number] Arrival date & time: 01/20/21  1446     History Chief Complaint  Patient presents with  . Cough    Nicholas Miller is a 60 y.o. male.  HPI 60 year old male with a history of anemia, GERD, hypertension, pyelonephritis presents to the ER with 1 week of nasal congestion, watery eyes, scratchy throat, productive cough with yellowish sputum. Patient states that he was tested for Covid on Tuesday and this was negative. He is vaccinated but not boosted. He has been taking over-the-counter Claritin and Mucinex nasal spray with little relief. He does not have the flu shot. Denies any fevers, is not sure if he has felt chilled or not. Denies any nausea or vomiting. No chest pain or shortness of breath. No other sick contacts at home.    Past Medical History:  Diagnosis Date  . Anemia   . GERD (gastroesophageal reflux disease)    conrolled diet.  . Hypertension   . Pyelonephritis 07/25/2018    Patient Active Problem List   Diagnosis Date Noted  . Pyelonephritis 07/25/2018  . Hyponatremia 07/25/2018  . Renal insufficiency 07/25/2018  . Hyperbilirubinemia 07/25/2018  . Normocytic anemia 07/25/2018  . Diarrhea with dehydration 07/25/2018    Past Surgical History:  Procedure Laterality Date  . BIOPSY  07/28/2018   Procedure: BIOPSY;  Surgeon: Carol Ada, MD;  Location: [facility] ENDOSCOPY;  Service: Endoscopy;;  . COLONOSCOPY N/A 07/28/2018   Procedure: COLONOSCOPY;  Surgeon: Carol Ada, MD;  Location: [place];  Service: Endoscopy;  Laterality: N/A;  . ESOPHAGOGASTRODUODENOSCOPY N/A 07/28/2018   Procedure: ESOPHAGOGASTRODUODENOSCOPY (EGD);  Surgeon: Carol Ada, MD;  Location: [place];  Service: Endoscopy;  Laterality: N/A;  . INGUINAL HERNIA REPAIR Left ~ 1972  . POLYPECTOMY  07/28/2018   Procedure: POLYPECTOMY;  Surgeon: Carol Ada, MD;  Location: [facility] ENDOSCOPY;  Service: Endoscopy;;       Family  History  Problem Relation Age of Onset  . Diabetes Mother     Social History   Tobacco Use  . Smoking status: Former Smoker    Packs/day: 0.50    Years: 1.00    Pack years: 0.50    Types: Cigarettes    Quit date: 04/21/1996    Years since quitting: 24.7  . Smokeless tobacco: Never Used  Vaping Use  . Vaping Use: Never used  Substance Use Topics  . Alcohol use: Never  . Drug use: Never    Home Medications Prior to Admission medications   Medication Sig Start Date End Date Taking? Authorizing Provider  atorvastatin (LIPITOR) 10 MG tablet Take 1 tablet (10 mg total) by mouth daily. 04/21/20   Saguier, Percell Miller, PA-C  ciprofloxacin (CIPRO) 500 MG tablet Take 1 tablet (500 mg total) by mouth 2 (two) times daily. 08/12/20   Saguier, Percell Miller, PA-C  ferrous sulfate 325 (65 FE) MG tablet Take 1 tablet (325 mg total) by mouth 2 (two) times daily with a meal. Patient not taking: Reported on 04/21/2020 07/28/18   Shelly Coss, MD  losartan (COZAAR) 25 MG tablet 1 tab po q day 04/21/20   Saguier, Percell Miller, PA-C  pantoprazole (PROTONIX) 40 MG tablet Take 1 tablet (40 mg total) by mouth daily. Patient not taking: Reported on 04/21/2020 07/28/18   Shelly Coss, MD    Allergies    Patient has no known allergies.  Review of Systems   Review of Systems  Constitutional: Positive for chills and fatigue. Negative for activity change, appetite  change and fever.  HENT: Positive for rhinorrhea, sinus pressure and sneezing. Negative for sore throat, tinnitus, trouble swallowing and voice change.   Respiratory: Positive for cough. Negative for shortness of breath and wheezing.   Cardiovascular: Negative for chest pain.  Gastrointestinal: Negative for abdominal pain, diarrhea, nausea and vomiting.  Genitourinary: Negative for dysuria.  Neurological: Negative for weakness.    Physical Exam Updated Vital Signs BP (!) 141/79 (BP Location: Left Arm)   Pulse 83   Temp 98 F (36.7 C) (Oral)   Resp 20    SpO2 100%   Physical Exam Vitals and nursing note reviewed.  Constitutional:      Appearance: He is well-developed and well-nourished.  HENT:     Head: Normocephalic and atraumatic.  Eyes:     Conjunctiva/sclera: Conjunctivae normal.  Cardiovascular:     Rate and Rhythm: Normal rate and regular rhythm.     Heart sounds: No murmur heard.   Pulmonary:     Effort: Pulmonary effort is normal. No respiratory distress.     Breath sounds: Normal breath sounds. No wheezing, rhonchi or rales.  Abdominal:     Palpations: Abdomen is soft.     Tenderness: There is no abdominal tenderness.  Musculoskeletal:        General: No edema.     Cervical back: Neck supple.  Skin:    General: Skin is warm and dry.  Neurological:     Mental Status: He is alert.  Psychiatric:        Mood and Affect: Mood and affect normal.     ED Results / Procedures / Treatments   Labs (all labs ordered are listed, but only abnormal results are displayed) Labs Reviewed  RAPID INFLUENZA A&B ANTIGENS  SARS CORONAVIRUS 2 (TAT 6-24 HRS)    EKG None  Radiology DG Chest Portable 1 View  Result Date: 01/20/2021 CLINICAL DATA:  Cough. EXAM: PORTABLE CHEST 1 VIEW COMPARISON:  December 03, 2019. FINDINGS: The heart size and mediastinal contours are within normal limits. Both lungs are clear. The visualized skeletal structures are unremarkable. IMPRESSION: No active disease. Electronically Signed   By: Marijo Conception M.D.   On: 01/20/2021 16:22    Procedures Procedures   Medications Ordered in ED Medications - No data to display  ED Course  I have reviewed the triage vital signs and the nursing notes.  Pertinent labs & imaging results that were available during my care of the patient were reviewed by me and considered in my medical decision making (see chart for details).    MDM Rules/Calculators/A&P                          61 year old male presents to the ER with URI symptoms. On arrival, vitals  overall reassuring, afebrile, not tachycardic, tachypneic or hypoxic. Very well-appearing, ambulating about the room with no difficulty. Lung sounds clear on exam, throat nonerythematous. Speaking in full sentences without increased work of breathing.  Plan for flu test, Covid test, chest x-ray to rule out a pneumonia.   Chest x-ray without evidence of pneumonia.  Well-appearing, with no evidence of hypoxia.  Patient was instructed to follow-up via the MyChart app. Suspect viral URI. Patient was encouraged to take Mucinex, flonase, zyrtec, ibuprofen/tylenol with PCP followup. Encouraged PCP follow-up. Return precautions discussed. He voiced understanding and is agreeable.  Stable for discharge  Final Clinical Impression(s) / ED Diagnoses Final diagnoses:  Upper respiratory tract infection, unspecified  type    Rx / DC Orders ED Discharge Orders    None       Lyndel Safe 01/20/21 1649    Little, Wenda Overland, MD 01/21/21 [phone]

## 2021-01-20 NOTE — ED Triage Notes (Signed)
Pt c/o flu like sx x 3 days-reports neg covid test 2 days ago-NAD-steady gait

## 2021-01-20 NOTE — Discharge Instructions (Addendum)
Read the instructions below on reasons to return to the emergency department and to learn more about your diagnosis.  Use over the counter medications for symptomatic relief as we discussed (musinex as a decongestant, Tylenol for fever/pain, Motrin/Ibuprofen for muscle aches). If prescribed a cough suppressant during your visit, do not operate heavy machinery with in 5 hours of taking this medication. Followup with your primary care doctor in 4 days if your symptoms persist.  Your more than welcome to return to the emergency department if symptoms worsen or become concerning.  Upper Respiratory Infection, Adult  An upper respiratory infection (URI) is also sometimes known as the common cold. Most people improve within 1 week, but symptoms can last up to 2 weeks. A residual cough may last even longer.   URI is most commonly caused by a virus. Viruses are NOT treated with antibiotics. You can easily spread the virus to others by oral contact. This includes kissing, sharing a glass, coughing, or sneezing. Touching your mouth or nose and then touching a surface, which is then touched by another person, can also spread the virus.   TREATMENT  Treatment is directed at relieving symptoms. There is no cure. Antibiotics are not effective, because the infection is caused by a virus, not by bacteria. Treatment may include:  Increased fluid intake. Sports drinks offer valuable electrolytes, sugars, and fluids.  Breathing heated mist or steam (vaporizer or shower).  Eating chicken soup or other clear broths, and maintaining good nutrition.  Getting plenty of rest.  Using gargles or lozenges for comfort.  Controlling fevers with ibuprofen or acetaminophen as directed by your caregiver.  Increasing usage of your inhaler if you have asthma.  Return to work when your temperature has returned to normal.  You may take over-the-counter Mucinex, flonase, zyrtec, ibuprofen/tylenol for your symptoms Your Covid test and  flu test are pending, you may follow-up on these results via the MyChart app. There is instructions on your discharge paperwork on how to download this.  SEEK MEDICAL CARE IF:  After the first few days, you feel you are getting worse rather than better.  You develop worsening shortness of breath, or brown or red sputum. These may be signs of pneumonia.  You develop yellow or brown nasal discharge or pain in the face, especially when you bend forward. These may be signs of sinusitis.  You develop a fever, swollen neck glands, pain with swallowing, or white areas in the back of your throat. These may be signs of strep throat.

## 2021-01-21 LAB — SARS CORONAVIRUS 2 (TAT 6-24 HRS): SARS Coronavirus 2: NEGATIVE

## 2021-02-25 ENCOUNTER — Other Ambulatory Visit: Payer: Self-pay

## 2021-02-25 ENCOUNTER — Emergency Department (HOSPITAL_BASED_OUTPATIENT_CLINIC_OR_DEPARTMENT_OTHER): Payer: Self-pay

## 2021-02-25 ENCOUNTER — Emergency Department (HOSPITAL_BASED_OUTPATIENT_CLINIC_OR_DEPARTMENT_OTHER)
Admission: EM | Admit: 2021-02-25 | Discharge: 2021-02-25 | Disposition: A | Discharge status: Home / Self Care | Payer: Self-pay | Attending: Emergency Medicine | Admitting: Emergency Medicine

## 2021-02-25 ENCOUNTER — Encounter (HOSPITAL_BASED_OUTPATIENT_CLINIC_OR_DEPARTMENT_OTHER): Payer: Self-pay | Admitting: *Deleted

## 2021-02-25 DIAGNOSIS — Z79899 Other long term (current) drug therapy: Secondary | ICD-10-CM | POA: Insufficient documentation

## 2021-02-25 DIAGNOSIS — R202 Paresthesia of skin: Secondary | ICD-10-CM | POA: Insufficient documentation

## 2021-02-25 DIAGNOSIS — M25562 Pain in left knee: Secondary | ICD-10-CM | POA: Insufficient documentation

## 2021-02-25 DIAGNOSIS — Z87891 Personal history of nicotine dependence: Secondary | ICD-10-CM | POA: Insufficient documentation

## 2021-02-25 DIAGNOSIS — I10 Essential (primary) hypertension: Secondary | ICD-10-CM | POA: Insufficient documentation

## 2021-02-25 MED ORDER — ACETAMINOPHEN 500 MG PO TABS: 500.0000 mg | ORAL_TABLET | Freq: Four times a day (QID) | ORAL | 0 refills | Status: AC | PRN

## 2021-02-25 MED ORDER — PREDNISONE 20 MG PO TABS: ORAL_TABLET | ORAL | 0 refills | Status: AC

## 2021-02-25 NOTE — Discharge Instructions (Signed)
Please read and follow all provided instructions.  Your diagnoses today include:  1. Acute pain of left knee     Tests performed today include:  An x-ray of the affected area - does NOT show any broken bones, shows mild arthritis  Vital signs. See below for your results today.   Medications prescribed:   Prednisone - steroid medicine   It is best to take this medication in the morning to prevent sleeping problems. If you are diabetic, monitor your blood sugar closely and stop taking Prednisone if blood sugar is over 300. Take with food to prevent stomach upset.   Take any prescribed medications only as directed.  Home care instructions:   Follow any educational materials contained in this packet  Follow R.I.C.E. Protocol:  R - rest your injury   I  - use ice on injury without applying directly to skin  C - compress injury with bandage or splint  E - elevate the injury as much as possible  Follow-up instructions: Please follow-up with the provided orthopedic physician. In this case you may have a more severe injury that requires further care.   Return instructions:   Please return if your toes or feet are numb or tingling, appear gray or blue, or you have severe pain (also elevate the leg and loosen splint or wrap if you were given one)  Please return to the Emergency Department if you experience worsening symptoms.   Please return if you have any other emergent concerns.  Additional Information:  Your vital signs today were: BP (!) 128/95 (BP Location: Right Arm)   Pulse 70   Temp 98.3 F (36.8 C) (Oral)   Resp 16   Ht 5\' 8"  (1.727 m)   Wt 112 kg   SpO2 97%   BMI 37.54 kg/m  If your blood pressure (BP) was elevated above 135/85 this visit, please have this repeated by your doctor within one month. --------------

## 2021-02-25 NOTE — ED Notes (Signed)
States he had no injuries or surgeries to left knee, having pain at lateral aspect of left knee, extended standing or sitting pain is worse and will develop numbness, "it wants to give out on me" at times. Pain ranges from aching to shooting pain to left ankle or pain can be at left hip area. Has strong dorsal and plantar flexion of left foot, color WNL, palpable pedal, post tib and popliteal pulse. Left foot warm to touch. Ice pack applied. Pt states has been using a knee brace

## 2021-02-25 NOTE — ED Notes (Signed)
AVS provided to client, reviewed ED MD instructions as well. Informed of the two (2) Rx sent to his selected pharmacy. Opportunity for questions provided.

## 2021-02-25 NOTE — ED Notes (Signed)
ED Provider at bedside. 

## 2021-02-25 NOTE — ED Provider Notes (Signed)
[place] EMERGENCY DEPARTMENT Provider Note   CSN: [id number] Arrival date & time: 02/25/21  1418     History Chief Complaint  Patient presents with  . Knee Pain    Nicholas Miller is a 60 y.o. male.  Patient presents the emergency department for evaluation of left knee pain that has been ongoing over the past several months.  Patient reports pain worse with bearing weight and with palpation of the knee.  He also states that the pain radiates up and down his leg at times. No gross swelling.  No back pain.  States that sometimes he has some numbness.  Pain occurs at rest and with activity.  Patient denies risk factors for DVT/PE including: unilateral leg swelling, history of DVT/PE/other blood clots, use of exogenous hormones, recent immobilizations, recent surgery, recent travel (>4hr segment), malignancy, hemoptysis.          Past Medical History:  Diagnosis Date  . Anemia   . GERD (gastroesophageal reflux disease)    conrolled diet.  . Hypertension   . Pyelonephritis 07/25/2018    Patient Active Problem List   Diagnosis Date Noted  . Pyelonephritis 07/25/2018  . Hyponatremia 07/25/2018  . Renal insufficiency 07/25/2018  . Hyperbilirubinemia 07/25/2018  . Normocytic anemia 07/25/2018  . Diarrhea with dehydration 07/25/2018    Past Surgical History:  Procedure Laterality Date  . BIOPSY  07/28/2018   Procedure: BIOPSY;  Surgeon: Carol Ada, MD;  Location: [facility] ENDOSCOPY;  Service: Endoscopy;;  . COLONOSCOPY N/A 07/28/2018   Procedure: COLONOSCOPY;  Surgeon: Carol Ada, MD;  Location: [place];  Service: Endoscopy;  Laterality: N/A;  . ESOPHAGOGASTRODUODENOSCOPY N/A 07/28/2018   Procedure: ESOPHAGOGASTRODUODENOSCOPY (EGD);  Surgeon: Carol Ada, MD;  Location: [place];  Service: Endoscopy;  Laterality: N/A;  . INGUINAL HERNIA REPAIR Left ~ 1972  . POLYPECTOMY  07/28/2018   Procedure: POLYPECTOMY;  Surgeon: Carol Ada, MD;  Location: [facility] ENDOSCOPY;   Service: Endoscopy;;       Family History  Problem Relation Age of Onset  . Diabetes Mother     Social History   Tobacco Use  . Smoking status: Former Smoker    Packs/day: 0.50    Years: 1.00    Pack years: 0.50    Types: Cigarettes    Quit date: 04/21/1996    Years since quitting: 24.8  . Smokeless tobacco: Never Used  Vaping Use  . Vaping Use: Never used  Substance Use Topics  . Alcohol use: Never  . Drug use: Never    Home Medications Prior to Admission medications   Medication Sig Start Date End Date Taking? Authorizing Provider  atorvastatin (LIPITOR) 10 MG tablet Take 1 tablet (10 mg total) by mouth daily. 04/21/20  Yes Saguier, Percell Miller, PA-C  ciprofloxacin (CIPRO) 500 MG tablet Take 1 tablet (500 mg total) by mouth 2 (two) times daily. 08/12/20   Saguier, Percell Miller, PA-C  ferrous sulfate 325 (65 FE) MG tablet Take 1 tablet (325 mg total) by mouth 2 (two) times daily with a meal. Patient not taking: No sig reported 07/28/18   Shelly Coss, MD  losartan (COZAAR) 25 MG tablet 1 tab po q day 04/21/20   Saguier, Percell Miller, PA-C  pantoprazole (PROTONIX) 40 MG tablet Take 1 tablet (40 mg total) by mouth daily. Patient not taking: No sig reported 07/28/18   Shelly Coss, MD    Allergies    Patient has no known allergies.  Review of Systems   Review of Systems  Constitutional: Negative for  activity change.  Cardiovascular: Negative for leg swelling.  Musculoskeletal: Positive for arthralgias, gait problem and joint swelling (not currently). Negative for back pain and neck pain.  Skin: Negative for wound.  Neurological: Positive for numbness. Negative for weakness.    Physical Exam Updated Vital Signs BP (!) 128/95 (BP Location: Right Arm)   Pulse 70   Temp 98.3 F (36.8 C) (Oral)   Resp 16   Ht 5\' 8"  (1.727 m)   Wt 112 kg   SpO2 97%   BMI 37.54 kg/m   Physical Exam Vitals and nursing note reviewed.  Constitutional:      Appearance: He is well-developed.   HENT:     Head: Normocephalic and atraumatic.  Eyes:     Conjunctiva/sclera: Conjunctivae normal.  Cardiovascular:     Pulses: Normal pulses. No decreased pulses.  Musculoskeletal:        General: Tenderness present.     Cervical back: Normal range of motion and neck supple.     Lumbar back: No tenderness.     Left knee: Bony tenderness present. No swelling or effusion. Normal range of motion. Tenderness present over the medial joint line and lateral joint line.     Comments: Left ankle: Normal range of motion, no tenderness Left lower leg: No tenderness or calf swelling/tenderness.  Symmetric compared to right lower leg Left thigh: No tenderness or swelling Left hip: No tenderness  Skin:    General: Skin is warm and dry.  Neurological:     Mental Status: He is alert.     Sensory: No sensory deficit.     Comments: Motor, sensation, and vascular distal to the injury is fully intact.      ED Results / Procedures / Treatments   Labs (all labs ordered are listed, but only abnormal results are displayed) Labs Reviewed - No data to display  EKG None  Radiology DG Knee Complete 4 Views Left  Result Date: 02/25/2021 CLINICAL DATA:  Chronic left knee pain without known injury. EXAM: LEFT KNEE - COMPLETE 4+ VIEW COMPARISON:  None. FINDINGS: No evidence of fracture, dislocation, or joint effusion. Minimal osteophyte formation is noted medially. Soft tissues are unremarkable. IMPRESSION: Minimal degenerative changes noted. No acute abnormality seen in the left knee. Electronically Signed   By: Marijo Conception M.D.   On: 02/25/2021 15:37    Procedures Procedures   Medications Ordered in ED Medications - No data to display  ED Course  I have reviewed the triage vital signs and the nursing notes.  Pertinent labs & imaging results that were available during my care of the patient were reviewed by me and considered in my medical decision making (see chart for details).  Patient seen  and examined.  X-ray of the knee is pending.  Patient also reports having pain in other parts of his legs at times, however none currently.  He does not have a significant effusion of the knee.  Considered DVT, however symptoms are not really suggestive of this and no current signs or symptoms of DVT.  2+ pedal pulses bilaterally, easily palpated.  Do not suspect arterial occlusion or arterial insufficiency.  No signs of cellulitis.  Do not suspect septic arthritis at this point.  Will need to avoid NSAIDs due to GI bleeding.  Vital signs reviewed and are as follows: BP (!) 128/95 (BP Location: Right Arm)   Pulse 70   Temp 98.3 F (36.8 C) (Oral)   Resp 16   Ht 5'  8" (1.727 m)   Wt 112 kg   SpO2 97%   BMI 37.54 kg/m   3:52 PM x-ray was negative for fracture, shows mild degenerative changes.  Patient updated on results.  We will discharged home on course of Tylenol and trial of prednisone.  Patient cannot take NSAIDs due to history of GI bleeding.  Will give sports medicine follow-up.   We discussed that at this point his exam is not very concerning for DVT.  However, if he were to develop worsening pain in the calf or thigh, swelling of the calf or thigh, he should return to the emergency department for further evaluation.  He verbalizes understanding agrees with plan.    MDM Rules/Calculators/A&P                          Patient with several months of knee pain.  No effusions or signs of septic arthritis today.  X-ray was negative for fracture, demonstrates mild degenerative changes.  He does report some radiation of pain into the thigh and calf at times, but not currently.  Overall exam is not concerning for DVT.  Do not feel that he need screened for this today, however symptoms should be closely monitored.  Sports medicine follow-up given and encouraged.   Final Clinical Impression(s) / ED Diagnoses Final diagnoses:  Acute pain of left knee    Rx / DC Orders ED Discharge Orders          Ordered    acetaminophen (TYLENOL) 500 MG tablet  Every 6 hours PRN        02/25/21 1546    predniSONE (DELTASONE) 20 MG tablet        02/25/21 1546           Carlisle Cater, PA-C 02/25/21 1553    Luna Fuse, MD 02/27/21 1527

## 2021-02-25 NOTE — ED Triage Notes (Signed)
Left knee pain x months. No injury. He has been using a knee brace that made it worse.

## 2021-03-16 ENCOUNTER — Ambulatory Visit: Payer: Self-pay | Admitting: Family Medicine

## 2021-03-16 NOTE — Progress Notes (Deleted)
  Isaia Hassell - 60 y.o. male MRN [id number]  Date of birth: [birth year]  SUBJECTIVE:  Including CC & ROS.  No chief complaint on file.   Lorie Melichar is a 60 y.o. male that is  ***.  ***   Review of Systems See HPI   HISTORY: Past Medical, Surgical, Social, and Family History Reviewed & Updated per EMR.   Pertinent Historical Findings include:  Past Medical History:  Diagnosis Date  . Anemia   . GERD (gastroesophageal reflux disease)    conrolled diet.  . Hypertension   . Pyelonephritis 07/25/2018    Past Surgical History:  Procedure Laterality Date  . BIOPSY  07/28/2018   Procedure: BIOPSY;  Surgeon: Carol Ada, MD;  Location: [facility] ENDOSCOPY;  Service: Endoscopy;;  . COLONOSCOPY N/A 07/28/2018   Procedure: COLONOSCOPY;  Surgeon: Carol Ada, MD;  Location: [place];  Service: Endoscopy;  Laterality: N/A;  . ESOPHAGOGASTRODUODENOSCOPY N/A 07/28/2018   Procedure: ESOPHAGOGASTRODUODENOSCOPY (EGD);  Surgeon: Carol Ada, MD;  Location: [place];  Service: Endoscopy;  Laterality: N/A;  . INGUINAL HERNIA REPAIR Left ~ 1972  . POLYPECTOMY  07/28/2018   Procedure: POLYPECTOMY;  Surgeon: Carol Ada, MD;  Location: [facility] ENDOSCOPY;  Service: Endoscopy;;    Family History  Problem Relation Age of Onset  . Diabetes Mother     Social History   Socioeconomic History  . Marital status: Married    Spouse name: Not on file  . Number of children: Not on file  . Years of education: Not on file  . Highest education level: Not on file  Occupational History  . Not on file  Tobacco Use  . Smoking status: Former Smoker    Packs/day: 0.50    Years: 1.00    Pack years: 0.50    Types: Cigarettes    Quit date: 04/21/1996    Years since quitting: 24.9  . Smokeless tobacco: Never Used  Vaping Use  . Vaping Use: Never used  Substance and Sexual Activity  . Alcohol use: Never  . Drug use: Never  . Sexual activity: Not on file  Other Topics Concern  . Not on file  Social  History Narrative  . Not on file   Social Determinants of Health   Financial Resource Strain: Not on file  Food Insecurity: Not on file  Transportation Needs: Not on file  Physical Activity: Not on file  Stress: Not on file  Social Connections: Not on file  Intimate Partner Violence: Not on file     PHYSICAL EXAM:  VS: There were no vitals taken for this visit. Physical Exam Gen: NAD, alert, cooperative with exam, well-appearing MSK:  ***      ASSESSMENT & PLAN:   No problem-specific Assessment & Plan notes found for this encounter.

## 2021-03-21 ENCOUNTER — Ambulatory Visit (INDEPENDENT_AMBULATORY_CARE_PROVIDER_SITE_OTHER): Payer: Self-pay | Admitting: Family Medicine

## 2021-03-21 ENCOUNTER — Other Ambulatory Visit: Payer: Self-pay

## 2021-03-21 ENCOUNTER — Ambulatory Visit: Payer: Self-pay

## 2021-03-21 ENCOUNTER — Encounter: Payer: Self-pay | Admitting: Family Medicine

## 2021-03-21 VITALS — BP 160/98 | Ht 68.0 in | Wt 246.0 lb

## 2021-03-21 DIAGNOSIS — M25562 Pain in left knee: Secondary | ICD-10-CM

## 2021-03-21 DIAGNOSIS — M1711 Unilateral primary osteoarthritis, right knee: Secondary | ICD-10-CM

## 2021-03-21 DIAGNOSIS — M23204 Derangement of unspecified medial meniscus due to old tear or injury, left knee: Secondary | ICD-10-CM

## 2021-03-21 MED ORDER — MELOXICAM 7.5 MG PO TABS: 7.5000 mg | ORAL_TABLET | Freq: Two times a day (BID) | ORAL | 1 refills | Status: AC | PRN

## 2021-03-21 NOTE — Assessment & Plan Note (Signed)
Right knee with more degenerative changes of the joint space than the meniscus.  No effusion on exam today. -Counseled on home exercise therapy and supportive care. -Green sport insoles. -Mobic. -Could consider injection or physical therapy.

## 2021-03-21 NOTE — Assessment & Plan Note (Signed)
Has lower degenerative tears appreciated of the medial and lateral meniscus of the left knee.  The seem to be more of the source of the pain as opposed to just the degenerative change of the joint space. -Counseled on home exercise therapy and supportive care. -Green sport insoles. -Mobic. -Could consider physical therapy or injection

## 2021-03-21 NOTE — Progress Notes (Signed)
Nicholas Miller - 60 y.o. male MRN [id number]  Date of birth: 08-20-1961  SUBJECTIVE:  Including CC & ROS.  No chief complaint on file.   Nicholas Miller is a 60 y.o. male that is presenting with acute on chronic bilateral knee pain.  Having pain over the medial compartment bilaterally.  Worse at the end of his shift.  No injury or inciting event.  No history of surgery..  Independent review of the left knee x-ray from 4/1 shows mild degenerative changes.   Review of Systems See HPI   HISTORY: Past Medical, Surgical, Social, and Family History Reviewed & Updated per EMR.   Pertinent Historical Findings include:  Past Medical History:  Diagnosis Date  . Anemia   . GERD (gastroesophageal reflux disease)    conrolled diet.  . Hypertension   . Pyelonephritis 07/25/2018    Past Surgical History:  Procedure Laterality Date  . BIOPSY  07/28/2018   Procedure: BIOPSY;  Surgeon: Carol Ada, MD;  Location: [facility] ENDOSCOPY;  Service: Endoscopy;;  . COLONOSCOPY N/A 07/28/2018   Procedure: COLONOSCOPY;  Surgeon: Carol Ada, MD;  Location: [place];  Service: Endoscopy;  Laterality: N/A;  . ESOPHAGOGASTRODUODENOSCOPY N/A 07/28/2018   Procedure: ESOPHAGOGASTRODUODENOSCOPY (EGD);  Surgeon: Carol Ada, MD;  Location: [place];  Service: Endoscopy;  Laterality: N/A;  . INGUINAL HERNIA REPAIR Left ~ 1972  . POLYPECTOMY  07/28/2018   Procedure: POLYPECTOMY;  Surgeon: Carol Ada, MD;  Location: [facility] ENDOSCOPY;  Service: Endoscopy;;    Family History  Problem Relation Age of Onset  . Diabetes Mother     Social History   Socioeconomic History  . Marital status: Married    Spouse name: Not on file  . Number of children: Not on file  . Years of education: Not on file  . Highest education level: Not on file  Occupational History  . Not on file  Tobacco Use  . Smoking status: Former Smoker    Packs/day: 0.50    Years: 1.00    Pack years: 0.50    Types: Cigarettes    Quit date:  04/21/1996    Years since quitting: 24.9  . Smokeless tobacco: Never Used  Vaping Use  . Vaping Use: Never used  Substance and Sexual Activity  . Alcohol use: Never  . Drug use: Never  . Sexual activity: Not on file  Other Topics Concern  . Not on file  Social History Narrative  . Not on file   Social Determinants of Health   Financial Resource Strain: Not on file  Food Insecurity: Not on file  Transportation Needs: Not on file  Physical Activity: Not on file  Stress: Not on file  Social Connections: Not on file  Intimate Partner Violence: Not on file     PHYSICAL EXAM:  VS: BP (!) 160/98 (BP Location: Right Arm, Patient Position: Sitting, Cuff Size: Large)   Ht 5\' 8"  (1.727 m)   Wt 246 lb (111.6 kg)   BMI 37.40 kg/m  Physical Exam Gen: NAD, alert, cooperative with exam, well-appearing MSK:  Right and left knee: No obvious effusion. Tenderness palpation the medial joint space. Normal range of motion. Normal strength resistance. Neurovascular intact  Limited ultrasound: Right knee, left knee:  Left knee: No effusion suprapatellar pouch. Normal-appearing quadricep patellar tendon. Moderate degenerative changes with horizontal tear of the lateral meniscus. Significant outpouching of the medial meniscus and degenerative changes of the joint space.  Right knee: No effusion within the suprapatellar pouch. Normal-appearing quadricep patellar tendon.  Moderate medial joint space narrowing.   Summary: Left knee with more degenerative changes of the meniscus. Right knee with degenerative changes of the joint space.  Ultrasound and interpretation by Clearance Coots, MD    ASSESSMENT & PLAN:   Degenerative tear of medial meniscus, left Has lower degenerative tears appreciated of the medial and lateral meniscus of the left knee.  The seem to be more of the source of the pain as opposed to just the degenerative change of the joint space. -Counseled on home exercise  therapy and supportive care. -Green sport insoles. -Mobic. -Could consider physical therapy or injection  Primary osteoarthritis of right knee Right knee with more degenerative changes of the joint space than the meniscus.  No effusion on exam today. -Counseled on home exercise therapy and supportive care. -Green sport insoles. -Mobic. -Could consider injection or physical therapy.

## 2021-03-21 NOTE — Patient Instructions (Signed)
Nice to meet you Please try ice  Please try the exercises  Please take the mobic as needed   Please send me a message in MyChart with any questions or updates.  Please see me back in 4 weeks.   --Dr. Raeford Razor

## 2023-03-12 ENCOUNTER — Encounter: Payer: Self-pay | Admitting: *Deleted

## 2024-09-26 ENCOUNTER — Emergency Department (HOSPITAL_BASED_OUTPATIENT_CLINIC_OR_DEPARTMENT_OTHER)
Admission: EM | Admit: 2024-09-26 | Discharge: 2024-09-26 | Disposition: A | Discharge status: Home / Self Care | Attending: Emergency Medicine | Admitting: Emergency Medicine

## 2024-09-26 ENCOUNTER — Encounter (HOSPITAL_BASED_OUTPATIENT_CLINIC_OR_DEPARTMENT_OTHER): Payer: Self-pay

## 2024-09-26 ENCOUNTER — Other Ambulatory Visit: Payer: Self-pay

## 2024-09-26 DIAGNOSIS — Z79899 Other long term (current) drug therapy: Secondary | ICD-10-CM | POA: Insufficient documentation

## 2024-09-26 DIAGNOSIS — I10 Essential (primary) hypertension: Secondary | ICD-10-CM | POA: Diagnosis present

## 2024-09-26 LAB — BASIC METABOLIC PANEL WITH GFR
Anion gap: 10 (ref 5–15)
BUN: 7 mg/dL — ABNORMAL LOW (ref 8–23)
CO2: 25 mmol/L (ref 22–32)
Calcium: 8.5 mg/dL — ABNORMAL LOW (ref 8.9–10.3)
Chloride: 107 mmol/L (ref 98–111)
Creatinine, Ser: 1.03 mg/dL (ref 0.61–1.24)
GFR, Estimated: >60 mL/min (ref >60)
Glucose, Bld: 109 mg/dL — ABNORMAL HIGH (ref 70–99)
Potassium: 3.8 mmol/L (ref 3.5–5.1)
Sodium: 142 mmol/L (ref 135–145)

## 2024-09-26 LAB — CBC WITH DIFFERENTIAL/PLATELET
Abs Immature Granulocytes: 0 K/uL (ref 0.00–0.07)
Basophils Absolute: 0.1 K/uL (ref 0.0–0.1)
Basophils Relative: 1 %
Eosinophils Absolute: 0.2 K/uL (ref 0.0–0.5)
Eosinophils Relative: 3 %
HCT: 36 % — ABNORMAL LOW (ref 39.0–52.0)
Hemoglobin: 11.6 g/dL — ABNORMAL LOW (ref 13.0–17.0)
Immature Granulocytes: 0 %
Lymphocytes Relative: 31 %
Lymphs Abs: 1.6 K/uL (ref 0.7–4.0)
MCH: 30.2 pg (ref 26.0–34.0)
MCHC: 32.2 g/dL (ref 30.0–36.0)
MCV: 93.8 fL (ref 80.0–100.0)
Monocytes Absolute: 0.6 K/uL (ref 0.1–1.0)
Monocytes Relative: 11 %
Neutro Abs: 2.9 K/uL (ref 1.7–7.7)
Neutrophils Relative %: 54 %
Platelets: 285 K/uL (ref 150–400)
RBC: 3.84 MIL/uL — ABNORMAL LOW (ref 4.22–5.81)
RDW: 12.1 % (ref 11.5–15.5)
WBC: 5.4 K/uL (ref 4.0–10.5)
nRBC: 0 % (ref 0.0–0.2)

## 2024-09-26 MED ORDER — AMLODIPINE BESYLATE 5 MG PO TABS: 5.0000 mg | ORAL_TABLET | Freq: Every day | ORAL | 0 refills | Status: AC

## 2024-09-26 NOTE — ED Triage Notes (Addendum)
 Pt went for colonoscopy today, unable to complete due to not being cleaned . Concerned due to pressure being elevated. Denies chest pain or headache today. Not currently taking BP medication Wife reports BP 183/112

## 2024-09-26 NOTE — ED Notes (Signed)
 D/c paperwork reviewed with pt, including prescriptions and follow up care.  All questions and/or concerns addressed at time of d/c.  No further needs expressed. . Pt verbalized understanding, Ambulatory with family to ED exit, NAD.

## 2024-09-26 NOTE — ED Provider Notes (Signed)
 [place] [facility] DEPARTMENT AT [facility] Provider Note   CSN: [id number] Arrival date & time: 09/26/24  1821     Patient presents with: Hypertension   Nicholas Miller is a 63 y.o. male.   Patient to ED concerned for elevated blood pressure. He denies any history of treatment for HTN and reports no regular medications for any other history. No chest pain, SOB, peripheral edema. He went to an appointment for a colonoscopy today where his blood pressure was found significantly elevated, wife reporting 183/112.   The history is provided by the patient. No language interpreter was used.  Hypertension       Prior to Admission medications   Medication Sig Start Date End Date Taking? Authorizing Provider  amLODipine (NORVASC) 5 MG tablet Take 1 tablet (5 mg total) by mouth daily. 09/26/24  Yes Kellyjo Edgren, Margit, PA-C  acetaminophen  (TYLENOL ) 500 MG tablet Take 1 tablet (500 mg total) by mouth every 6 (six) hours as needed. 02/25/21   Desiderio Chew, PA-C  atorvastatin  (LIPITOR) 10 MG tablet Take 1 tablet (10 mg total) by mouth daily. 04/21/20   Saguier, [place], PA-C  losartan  (COZAAR ) 25 MG tablet 1 tab po q day 04/21/20   Saguier, [place], PA-C  meloxicam  (MOBIC ) 7.5 MG tablet Take 1 tablet (7.5 mg total) by mouth 2 (two) times daily as needed. 03/21/21   Chick Venetia BRAVO, MD  predniSONE  (DELTASONE ) 20 MG tablet 3 Tabs PO Days 1-3, then 2 tabs PO Days 4-6, then 1 tab PO Day 7-9, then Half Tab PO Day 10-12 02/25/21   Desiderio Chew, PA-C  ferrous sulfate  325 (65 FE) MG tablet Take 1 tablet (325 mg total) by mouth 2 (two) times daily with a meal. Patient not taking: No sig reported 07/28/18 02/25/21  Jillian Buttery, MD  pantoprazole  (PROTONIX ) 40 MG tablet Take 1 tablet (40 mg total) by mouth daily. Patient not taking: No sig reported 07/28/18 02/25/21  Jillian Buttery, MD    Allergies: Patient has no known allergies.    Review of Systems  Updated Vital Signs BP (!) 159/82   Pulse 75    Temp 98.3 F (36.8 C) (Oral)   Resp (!) 22   SpO2 97%   Physical Exam Constitutional:      Appearance: He is well-developed.  HENT:     Head: Normocephalic.  Cardiovascular:     Rate and Rhythm: Normal rate and regular rhythm.     Heart sounds: No murmur heard. Pulmonary:     Effort: Pulmonary effort is normal.     Breath sounds: Normal breath sounds. No wheezing, rhonchi or rales.  Abdominal:     General: Bowel sounds are normal.     Palpations: Abdomen is soft.     Tenderness: There is no abdominal tenderness. There is no guarding or rebound.  Musculoskeletal:        General: Normal range of motion.     Cervical back: Normal range of motion and neck supple.     Right lower leg: No edema.     Left lower leg: No edema.  Skin:    General: Skin is warm and dry.  Neurological:     General: No focal deficit present.     Mental Status: He is alert and oriented to person, place, and time.     (all labs ordered are listed, but only abnormal results are displayed) Labs Reviewed  BASIC METABOLIC PANEL WITH GFR - Abnormal; Notable for the following components:  Result Value   Glucose, Bld 109 (*)    BUN 7 (*)    Calcium  8.5 (*)    All other components within normal limits  CBC WITH DIFFERENTIAL/PLATELET - Abnormal; Notable for the following components:   RBC 3.84 (*)    Hemoglobin 11.6 (*)    HCT 36.0 (*)    All other components within normal limits    EKG: EKG Interpretation Date/Time:  Friday September 26 2024 19:31:00 EDT Ventricular Rate:  79 PR Interval:  168 QRS Duration:  88 QT Interval:  404 QTC Calculation: 466 R Axis:   31  Text Interpretation: Sinus rhythm Inferior infarct, old Confirmed by Yolande Charleston [phone]) on 09/26/2024 7:35:36 PM  Radiology: No results found.   Procedures   Medications Ordered in the ED - No data to display  Clinical Course as of 09/26/24 2046  Fri Sep 26, 2024  2017 BP 159/93. EKG without acute changes. NOrmal  renal function. Mild anemia, stable.   Patient concerned about elevated BP. Per chart review he was started on Losartan  By PA Saguier in May of 2021. Patient does not remember ever having been started on any medications or having a diagnosis of HTN. Discussed the importance of f/u with PCP. He is considered stable for discharge home.  [SU]  2039 Discussed with Dr. Yolande at discharge. Recommends starting the patient on Norvasc 5 mg. [SU]    Clinical Course User Index [SU] Odell Balls, PA-C                                 Medical Decision Making Amount and/or Complexity of Data Reviewed Labs: ordered.        Final diagnoses:  Hypertension, unspecified type    ED Discharge Orders          Ordered    amLODipine (NORVASC) 5 MG tablet  Daily        09/26/24 2042               Odell Balls, PA-C 09/26/24 2046    Yolande Charleston BROCKS, MD 09/27/24 [phone]

## 2024-09-26 NOTE — Anesthesia Preprocedure Evaluation (Signed)
 Patient: Nicholas Miller  Procedure Information     Anesthesia Start Date/Time: 09/26/24 1101   Scheduled providers: Gunnar Chris Daniels, MD; Ozell JINNY Fridge, MD; Candiss Debby Splinter, CRNA   Procedure: COLONOSCOPY   Location: [facility] - OUTPATIENT ENDOSCOPY [facility]       Relevant Problems  No relevant active problems    BP (!) 155/94   Pulse 81   Temp 98 F (36.7 C) (Temporal)   Resp 17   SpO2 98%    Clinical information reviewed:  Allergies  Meds  Med Hx  Surg Hx  Fam Hx  Soc Hx     Anesthesia Evaluation  Anesthesia History: Patient has no history of anesthetic complications.  PONV Predictive Score (Scale 0-5):  Apfel risk score: 0 Renal/Gastrointestinal: Additional comments: Fatty liver    Physical Exam  Airway  Mallampati: III TM Distance (FB): 3 Oral Aperture (FB): 3 Neck ROM: full ROM Cardiovascular - normal exam Dental - normal exam Pulmonary - normal exam   Anesthesia Plan  Review Preop documentation reviewed: H&P reviewed, NPO Status Reviewed, Preop Vitals Reviewed and Periop Tests and Results Reviewed  Plan ASA score: 2  Anesthesia type: general Induction: intravenous Post-op Plan: GI.  Informed Consent Anesthetic plan and risks discussed with patient. Plan discussed with CRNA.   Date of Last Liquid: 09/26/24 Time of last liquid: 0200 Date of Last Solid: 09/24/24 Time of last solid: 2000

## 2024-09-26 NOTE — H&P (Signed)
 The patient may be discharged from the care of  Endo Unit when they consistently maintain acceptable vital signs and consciousness recovery scores, and satisfactorily fulfill applicable components of the discharge checklist. Please notify me or other qualified procedural team members to reevaluate the patient if this does not occur within 2 hours of beginning the recovery period or immediately if any emergency arises.    Patients who required active interventions to support hemodynamics, oxygenation, and/or ventilation during the procedural sedation continuum (such as, but not limited to reversal agent administration, addition of new supplemental oxygen administration during the sedation recovery process, or vasopressor/fluid bolus support of blood pressure) must be accompanied by the recovery area nurse during transport to their hospital room to provide continuous clinical and physiologic monitoring.

## 2024-09-26 NOTE — H&P (Signed)
 Gastroenterology Preprocedural History and Physical     Chief Complaint/Reason for Procedure: Nicholas Miller is a 63 y.o. male scheduled for a Colonoscopy, for the following indication Colon Cancer Screening using deep sedation with propofol or general anesthesia as per anesthesia provider .  A History and Physical has been performed and patient medication allergies have been reviewed. The patient's tolerance of previous anesthesia has been reviewed. The risks and benefits of the procedure and the sedation options and risks were discussed with the patient. All questions were answered and informed consent obtained.  HPI  Medical History[1]  Surgical History[2]  Family History[3]  Social History   Socioeconomic History  . Marital status: Single    Spouse name: Not on file  . Number of children: Not on file  . Years of education: Not on file  . Highest education level: Not on file  Occupational History  . Not on file  Tobacco Use  . Smoking status: Not on file  . Smokeless tobacco: Not on file  Substance and Sexual Activity  . Alcohol use: Not on file  . Drug use: Not on file  . Sexual activity: Not on file  Other Topics Concern  . Not on file  Social History Narrative  . Not on file   Social Drivers of Health   Food Insecurity: Not on file  Transportation Needs: Not on file  Safety: Not on file  Living Situation: Not on file    Current Rx ordered in [facility][4]  Allergies[5]    Physical Exam:  There were no vitals filed for this visit. There is no height or weight on file to calculate BMI.  Airway:  MALLAMPATI TWO   Heart:  normal S1 and S2 Lungs:  clear Abdomen:  soft, nontender, normal bowel sounds Mental Status:  awake and alert; oriented to person, place, and time     ASA Grade Assessment: ASA 2 - Patient with mild systemic disease with no functional limitations   I have reviewed patient's health history and patient is cleared to proceed with the  proposed procedure at this facility.   Tri Chris Daniels, MD       [1] History reviewed. No pertinent past medical history. [2] History reviewed. No pertinent surgical history. [3] No family history on file. [4] No current Epic-ordered outpatient medications on file.   Current Facility-Administered Medications Ordered in Epic  Medication Dose Route Frequency Provider Last Rate Last Admin  . lactated ringer's infusion   intravenous Continuous PRN [place] Debby Splinter, CRNA   New Bag at 09/26/24 1036  [5] No Known Allergies

## 2024-09-26 NOTE — Telephone Encounter (Signed)
 Please contact patient to reschedule.  Colonoscopy today was poor prep. Needs 2 days prep instruction

## 2024-09-26 NOTE — Discharge Instructions (Signed)
 As we discussed, take Norvasc daily to treat blood pressure. It is important to establish with a primary care provider to continue treatment.   Return to the ED with any new or concerns.
